# Patient Record
Sex: Male | Born: 1955 | Race: White | Hispanic: No | Marital: Single | Smoking: Current every day smoker
Health system: Southern US, Community
[De-identification: ages and names within clinical notes are randomized; demographics above are authoritative.]

## PROBLEM LIST (undated history)

## (undated) DIAGNOSIS — T148XXA Other injury of unspecified body region, initial encounter: Secondary | ICD-10-CM

## (undated) DIAGNOSIS — A159 Respiratory tuberculosis unspecified: Secondary | ICD-10-CM

## (undated) DIAGNOSIS — B20 Human immunodeficiency virus [HIV] disease: Secondary | ICD-10-CM

## (undated) DIAGNOSIS — K632 Fistula of intestine: Secondary | ICD-10-CM

## (undated) DIAGNOSIS — Z933 Colostomy status: Secondary | ICD-10-CM

## (undated) HISTORY — PX: APPENDECTOMY: SHX54

## (undated) HISTORY — PX: COLON SURGERY: SHX602

## (undated) HISTORY — PX: SMALL INTESTINE SURGERY: SHX150

## (undated) HISTORY — PX: CHOLECYSTECTOMY: SHX55

---

## 2006-04-06 DIAGNOSIS — T148XXA Other injury of unspecified body region, initial encounter: Secondary | ICD-10-CM

## 2006-04-06 HISTORY — PX: OTHER SURGICAL HISTORY: SHX169

## 2006-04-06 HISTORY — DX: Other injury of unspecified body region, initial encounter: T14.8XXA

## 2013-06-26 ENCOUNTER — Emergency Department (HOSPITAL_COMMUNITY)

## 2013-06-26 ENCOUNTER — Inpatient Hospital Stay (HOSPITAL_COMMUNITY)

## 2013-06-26 ENCOUNTER — Encounter (HOSPITAL_COMMUNITY): Payer: Self-pay | Admitting: Adult Health

## 2013-06-26 ENCOUNTER — Inpatient Hospital Stay (HOSPITAL_COMMUNITY)
Admission: EM | Admit: 2013-06-26 | Discharge: 2013-07-04 | DRG: 682 | Disposition: A | Discharge status: Home / Self Care | Attending: Pulmonary Disease | Admitting: Pulmonary Disease

## 2013-06-26 DIAGNOSIS — E861 Hypovolemia: Secondary | ICD-10-CM | POA: Diagnosis present

## 2013-06-26 DIAGNOSIS — D649 Anemia, unspecified: Secondary | ICD-10-CM | POA: Diagnosis not present

## 2013-06-26 DIAGNOSIS — R918 Other nonspecific abnormal finding of lung field: Secondary | ICD-10-CM

## 2013-06-26 DIAGNOSIS — F17200 Nicotine dependence, unspecified, uncomplicated: Secondary | ICD-10-CM | POA: Diagnosis present

## 2013-06-26 DIAGNOSIS — R222 Localized swelling, mass and lump, trunk: Secondary | ICD-10-CM

## 2013-06-26 DIAGNOSIS — N183 Chronic kidney disease, stage 3 unspecified: Secondary | ICD-10-CM | POA: Diagnosis present

## 2013-06-26 DIAGNOSIS — N19 Unspecified kidney failure: Secondary | ICD-10-CM

## 2013-06-26 DIAGNOSIS — B20 Human immunodeficiency virus [HIV] disease: Secondary | ICD-10-CM | POA: Diagnosis present

## 2013-06-26 DIAGNOSIS — J984 Other disorders of lung: Secondary | ICD-10-CM

## 2013-06-26 DIAGNOSIS — I82409 Acute embolism and thrombosis of unspecified deep veins of unspecified lower extremity: Secondary | ICD-10-CM

## 2013-06-26 DIAGNOSIS — L270 Generalized skin eruption due to drugs and medicaments taken internally: Secondary | ICD-10-CM

## 2013-06-26 DIAGNOSIS — E872 Acidosis, unspecified: Secondary | ICD-10-CM | POA: Diagnosis present

## 2013-06-26 DIAGNOSIS — Z418 Encounter for other procedures for purposes other than remedying health state: Secondary | ICD-10-CM

## 2013-06-26 DIAGNOSIS — Z933 Colostomy status: Secondary | ICD-10-CM

## 2013-06-26 DIAGNOSIS — I959 Hypotension, unspecified: Secondary | ICD-10-CM

## 2013-06-26 DIAGNOSIS — N179 Acute kidney failure, unspecified: Principal | ICD-10-CM

## 2013-06-26 DIAGNOSIS — Z21 Asymptomatic human immunodeficiency virus [HIV] infection status: Secondary | ICD-10-CM

## 2013-06-26 DIAGNOSIS — E875 Hyperkalemia: Secondary | ICD-10-CM

## 2013-06-26 DIAGNOSIS — I10 Essential (primary) hypertension: Secondary | ICD-10-CM | POA: Diagnosis present

## 2013-06-26 DIAGNOSIS — A150 Tuberculosis of lung: Secondary | ICD-10-CM

## 2013-06-26 DIAGNOSIS — J189 Pneumonia, unspecified organism: Secondary | ICD-10-CM

## 2013-06-26 DIAGNOSIS — E871 Hypo-osmolality and hyponatremia: Secondary | ICD-10-CM | POA: Diagnosis present

## 2013-06-26 DIAGNOSIS — J438 Other emphysema: Secondary | ICD-10-CM | POA: Diagnosis present

## 2013-06-26 DIAGNOSIS — Z2989 Encounter for other specified prophylactic measures: Secondary | ICD-10-CM

## 2013-06-26 DIAGNOSIS — Z23 Encounter for immunization: Secondary | ICD-10-CM

## 2013-06-26 HISTORY — DX: Colostomy status: Z93.3

## 2013-06-26 HISTORY — DX: Human immunodeficiency virus (HIV) disease: B20

## 2013-06-26 HISTORY — DX: Respiratory tuberculosis unspecified: A15.9

## 2013-06-26 HISTORY — DX: Fistula of intestine: K63.2

## 2013-06-26 HISTORY — DX: Other injury of unspecified body region, initial encounter: T14.8XXA

## 2013-06-26 LAB — BASIC METABOLIC PANEL
BUN: 111 mg/dL — ABNORMAL HIGH (ref 6–23)
BUN: 102 mg/dL — ABNORMAL HIGH (ref 6–23)
CALCIUM: 10.3 mg/dL (ref 8.4–10.5)
CALCIUM: 8.4 mg/dL (ref 8.4–10.5)
CO2: 17 mEq/L — ABNORMAL LOW (ref 19–32)
CO2: 16 mEq/L — ABNORMAL LOW (ref 19–32)
CREATININE: 2.92 mg/dL — AB (ref 0.50–1.35)
Chloride: 86 mEq/L — ABNORMAL LOW (ref 96–112)
Chloride: 96 mEq/L (ref 96–112)
Creatinine, Ser: 3.5 mg/dL — ABNORMAL HIGH (ref 0.50–1.35)
GFR calc Af Amer: 26 mL/min — ABNORMAL LOW (ref >90)
GFR calc non Af Amer: 23 mL/min — ABNORMAL LOW (ref >90)
GFR, EST AFRICAN AMERICAN: 21 mL/min — AB (ref >90)
GFR, EST NON AFRICAN AMERICAN: 18 mL/min — AB (ref >90)
Glucose, Bld: 124 mg/dL — ABNORMAL HIGH (ref 70–99)
Glucose, Bld: 107 mg/dL — ABNORMAL HIGH (ref 70–99)
Potassium: 5.8 mEq/L — ABNORMAL HIGH (ref 3.7–5.3)
SODIUM: 122 meq/L — AB (ref 137–147)
Sodium: 128 mEq/L — ABNORMAL LOW (ref 137–147)

## 2013-06-26 LAB — URINALYSIS, ROUTINE W REFLEX MICROSCOPIC
BILIRUBIN URINE: NEGATIVE
Glucose, UA: NEGATIVE mg/dL
Hgb urine dipstick: NEGATIVE
Ketones, ur: NEGATIVE mg/dL
LEUKOCYTES UA: NEGATIVE
Nitrite: NEGATIVE
PH: 5 (ref 5.0–8.0)
Protein, ur: NEGATIVE mg/dL
SPECIFIC GRAVITY, URINE: 1.022 (ref 1.005–1.030)
Urobilinogen, UA: 0.2 mg/dL (ref 0.0–1.0)

## 2013-06-26 LAB — CBC
HCT: 43.2 % (ref 39.0–52.0)
Hemoglobin: 14.6 g/dL (ref 13.0–17.0)
MCH: 25.4 pg — ABNORMAL LOW (ref 26.0–34.0)
MCHC: 33.8 g/dL (ref 30.0–36.0)
MCV: 75.3 fL — ABNORMAL LOW (ref 78.0–100.0)
Platelets: 265 10*3/uL (ref 150–400)
RBC: 5.74 MIL/uL (ref 4.22–5.81)
RDW: 16.2 % — AB (ref 11.5–15.5)
WBC: 7.5 10*3/uL (ref 4.0–10.5)

## 2013-06-26 LAB — TROPONIN I: Troponin I: <0.3 ng/mL (ref <0.30)

## 2013-06-26 LAB — I-STAT TROPONIN, ED: Troponin i, poc: 0.01 ng/mL (ref 0.00–0.08)

## 2013-06-26 LAB — TSH: TSH: 2.899 u[IU]/mL (ref 0.350–4.500)

## 2013-06-26 LAB — MAGNESIUM: Magnesium: 1.9 mg/dL (ref 1.5–2.5)

## 2013-06-26 LAB — PRO B NATRIURETIC PEPTIDE: Pro B Natriuretic peptide (BNP): 936.1 pg/mL — ABNORMAL HIGH (ref 0–125)

## 2013-06-26 LAB — PHOSPHORUS: Phosphorus: 5.8 mg/dL — ABNORMAL HIGH (ref 2.3–4.6)

## 2013-06-26 LAB — MRSA PCR SCREENING: MRSA by PCR: NEGATIVE

## 2013-06-26 LAB — STREP PNEUMONIAE URINARY ANTIGEN: Strep Pneumo Urinary Antigen: NEGATIVE

## 2013-06-26 LAB — D-DIMER, QUANTITATIVE (NOT AT ARMC): D DIMER QUANT: 1.29 ug{FEU}/mL — AB (ref 0.00–0.48)

## 2013-06-26 LAB — PROCALCITONIN: Procalcitonin: 0.27 ng/mL

## 2013-06-26 MED ORDER — SODIUM CHLORIDE 0.9 % IV BOLUS (SEPSIS)
500.0000 mL | Freq: Once | INTRAVENOUS | Status: AC
  Administered 2013-06-26: 500 mL via INTRAVENOUS

## 2013-06-26 MED ORDER — AZITHROMYCIN 500 MG IV SOLR
500.0000 mg | Freq: Once | INTRAVENOUS | Status: AC
  Administered 2013-06-26: 500 mg via INTRAVENOUS

## 2013-06-26 MED ORDER — SODIUM CHLORIDE 0.9 % IV BOLUS (SEPSIS)
1000.0000 mL | Freq: Once | INTRAVENOUS | Status: AC
Start: 2013-06-26 — End: 2013-06-26
  Administered 2013-06-26: 1000 mL via INTRAVENOUS

## 2013-06-26 MED ORDER — SODIUM BICARBONATE 8.4 % IV SOLN
50.0000 meq | Freq: Once | INTRAVENOUS | Status: AC
  Administered 2013-06-26: 50 meq via INTRAVENOUS
  Filled 2013-06-26: qty 50

## 2013-06-26 MED ORDER — INSULIN ASPART 100 UNIT/ML IV SOLN
10.0000 [IU] | Freq: Once | INTRAVENOUS | Status: AC
  Administered 2013-06-26: 10 [IU] via INTRAVENOUS
  Filled 2013-06-26: qty 0.1

## 2013-06-26 MED ORDER — SODIUM CHLORIDE 0.9 % IV SOLN
1.0000 g | Freq: Once | INTRAVENOUS | Status: AC
  Administered 2013-06-26: 1 g via INTRAVENOUS
  Filled 2013-06-26: qty 10

## 2013-06-26 MED ORDER — TECHNETIUM TC 99M DIETHYLENETRIAME-PENTAACETIC ACID: 40.0000 | Freq: Once | INTRAVENOUS | Status: AC | PRN

## 2013-06-26 MED ORDER — SODIUM CHLORIDE 0.9 % IV BOLUS (SEPSIS)
2000.0000 mL | Freq: Once | INTRAVENOUS | Status: AC
Start: 2013-06-26 — End: 2013-06-26
  Administered 2013-06-26: 2000 mL via INTRAVENOUS

## 2013-06-26 MED ORDER — SODIUM CHLORIDE 0.9 % IV SOLN
INTRAVENOUS | Status: DC
  Administered 2013-06-26: 12:00:00 via INTRAVENOUS
  Administered 2013-06-27: 100 mL/h via INTRAVENOUS

## 2013-06-26 MED ORDER — TUBERCULIN PPD 5 UNIT/0.1ML ID SOLN
5.0000 [IU] | Freq: Once | INTRADERMAL | Status: AC
  Administered 2013-06-26: 5 [IU] via INTRADERMAL
  Filled 2013-06-26: qty 0.1

## 2013-06-26 MED ORDER — TECHNETIUM TO 99M ALBUMIN AGGREGATED
6.0000 | Freq: Once | INTRAVENOUS | Status: AC | PRN
  Administered 2013-06-26: 6 via INTRAVENOUS

## 2013-06-26 MED ORDER — DEXTROSE 50 % IV SOLN
1.0000 | Freq: Once | INTRAVENOUS | Status: AC
  Administered 2013-06-26: 50 mL via INTRAVENOUS
  Filled 2013-06-26: qty 50

## 2013-06-26 MED ORDER — ALBUTEROL SULFATE (2.5 MG/3ML) 0.083% IN NEBU
10.0000 mg | INHALATION_SOLUTION | Freq: Once | RESPIRATORY_TRACT | Status: AC
  Administered 2013-06-26: 10 mg via RESPIRATORY_TRACT
  Filled 2013-06-26: qty 12

## 2013-06-26 MED ORDER — SODIUM CHLORIDE 0.9 % IV SOLN: 250.0000 mL | INTRAVENOUS | Status: DC | PRN

## 2013-06-26 MED ORDER — CALCIUM GLUCONATE 10 % IV SOLN
1.0000 g | Freq: Once | INTRAVENOUS | Status: DC
  Filled 2013-06-26: qty 10

## 2013-06-26 MED ORDER — DEXTROSE 5 % IV SOLN
2.0000 g | INTRAVENOUS | Status: DC
  Administered 2013-06-26 – 2013-06-29 (×4): 2 g via INTRAVENOUS
  Filled 2013-06-26 (×6): qty 2

## 2013-06-26 MED ORDER — HEPARIN SODIUM (PORCINE) 5000 UNIT/ML IJ SOLN
5000.0000 [IU] | Freq: Three times a day (TID) | INTRAMUSCULAR | Status: DC
  Administered 2013-06-26 – 2013-06-27 (×2): 5000 [IU] via SUBCUTANEOUS
  Filled 2013-06-26 (×5): qty 1

## 2013-06-26 NOTE — H&P (Signed)
PULMONARY / CRITICAL CARE MEDICINE   Name: David Fry MRN: [id number] DOB: [birth year]    ADMISSION DATE:  06/26/2013  REFERRING MD :  EDP  PRIMARY SERVICE: PCCM   CHIEF COMPLAINT:  Dyspnea, acute renal failure   BRIEF PATIENT DESCRIPTION: 58 yo active smoker without significant medical history [place], presented 3/23 with 2 weeks of dyspnea, weakness and fatigue.  In ER found to be in acute renal failure with hyperkalemia, hypotensive, with RUL cavitary infiltrate.   SIGNIFICANT EVENTS / STUDIES:  3/23  CT chest >>> 3/23 VQ scan >>> 3/23  BLE dopplers >>>  LINES / TUBES:  CULTURES: 3/23  Sputum 3/23  >>> 3/23  BCx2 3/23  >>> 3/23  Urine Strep Ag >>> 3/23  Urine Legionella Ag  >>>  ANTIBIOTICS: Rocephin 3/23 >>> Azithrimycin 3/23 >>>  HISTORY OF PRESENT ILLNESS:  58 yo male with only PMH crush injury to abdomen 2008 [place], presented 3/23 with 2 week hx dyspnea, weakness and fatigue.  In ER found to have significant acute renal failure with hyperkalemia, hypotension and RUL cavitary infiltrate.  Pt has been feeling weak, DOE x 2 weeks.  Currently works at [employer] desk but previously as a Medical sales representativemechanic in aviation.  Has traveled all over the world  No known TB exposure.  Has had neg PPD in past. Denies leg/calf pain, hemoptysis, chest pain.  No notable weight loss recently but poor appetite.   PAST MEDICAL HISTORY :  Past Medical History  Diagnosis Date  . Crush injury trunk 2008  . Colostomy status    Past Surgical History  Procedure Laterality Date  . Abdominal surgery  2008   Prior to Admission medications   Not on File   No Known Allergies  FAMILY HISTORY:  No family history on file. SOCIAL HISTORY:  reports that he has been smoking Cigarettes.  He has a 40 pack-year smoking history. He does not have any smokeless tobacco history on file. His alcohol and drug histories are not on file.  REVIEW OF SYSTEMS:   As per HPI - All other systems reviewed and were neg.   SUBJECTIVE:   VITAL SIGNS: Temp:  [97.9 F (36.6 C)] 97.9 F (36.6 C) (03/23 0750) Pulse Rate:  [99] 99 (03/23 0723) Resp:  [18] 18 (03/23 0723) BP: (104)/(73) 104/73 mmHg (03/23 0723) SpO2:  [97 %] 97 % (03/23 0857)  HEMODYNAMICS:   VENTILATOR SETTINGS:   INTAKE / OUTPUT: Intake/Output   None     PHYSICAL EXAMINATION: General:  Pleasant male, NAD appears older than stated age Neuro:  Awake, alert, appropriate HEENT:  Mm moist, no JVD  Cardiovascular:  s1s2 rrr, intermittent tachy Lungs:  resps even, non labored on [place], slightly diminished R, otherwise essentially clear  Abdomen:  Soft, +bs, colostomy Musculoskeletal:  Warm and dry, no sig edema   LABS:  CBC  Recent Labs Lab 06/26/13 0741  WBC 7.5  HGB 14.6  HCT 43.2  PLT 265   Coag's No results found for this basename: APTT, INR,  in the last 168 hours BMET  Recent Labs Lab 06/26/13 0741  NA 122*  K >7.7*  CL 86*  CO2 17*  BUN 111*  CREATININE 3.50*  GLUCOSE 124*   Electrolytes  Recent Labs Lab 06/26/13 0741  CALCIUM 10.3   Sepsis Markers No results found for this basename: LATICACIDVEN, PROCALCITON, O2SATVEN,  in the last 168 hours ABG No results found for this  basename: PHART, PCO2ART, PO2ART,  in the last 168 hours Liver Enzymes No results found for this basename: AST, ALT, ALKPHOS, BILITOT, ALBUMIN,  in the last 168 hours Cardiac Enzymes  Recent Labs Lab 06/26/13 0741  PROBNP 936.1*   Glucose No results found for this basename: GLUCAP,  in the last 168 hours  Imaging Dg Chest 2 View  06/26/2013   CLINICAL DATA:  Weakness and dyspnea  EXAM: CHEST  2 VIEW  COMPARISON:  No comparisons  FINDINGS: The lungs are well-expanded. There are abnormal parenchymal densities in the right upper lobe. There is an air-fluid level within 1 of these densities. There is subtle density that projects in the region of the left first costosternal  junction but this may be due to costochondral junction prominence. The cardiac silhouette is normal in size. The pulmonary vascularity is not engorged. The mediastinum is normal in width. There is no pleural effusion though the tips of the costophrenic angles are not completely included in the field of view.  IMPRESSION: 1. Patchy cavitary infiltrates in the right upper lobe are demonstrated. These findings could be infectious or neoplastic. Tuberculosis is in the differential. There may be minimal density in the left apex though this is not a definite finding. Further evaluation with chest CT scanning now is recommended. 2. There is borderline hyperinflation which may reflect an element of COPD. There is no evidence of CHF. No definite pleural effusion is demonstrated. 3. These results were called by telephone at the time of interpretation on 06/26/2013 at 8:56 AM to [facility], [facility] , who verbally acknowledged these results.   Electronically Signed   By: David  [place]   On: 06/26/2013 08:57   ASSESSMENT / PLAN:  PULMONARY A: Suspected COPD / emphysema Possible cavitary pneumonia Possible CAP overimposed on bullous apical changes Less likely cavitary lung mass Doubt pulmonary TB Fungal infection? PE unlikely P:   CT chest without contrast  VQ scan r/o PE - drove country immediately prior to onset dyspnea  BL LE Venous Dopplers  May need FOB  CARDIOVASCULAR A: No active issue  P:  No intervention required  RENAL Dehydration / hypovolemia Acute renal failure  Hyperkalemia Hyponatremia  P:   Ca / bicarbonate / D50 / insulin Trend BMP NS x 3L NS@100  Renal ultrasound   GASTROINTESTINAL A: Colostomy, previous crush injury, developed small bowel fistula Nutrition GI Px is not indicate P:   Regular diet  HEMATOLOGIC A: No active issue  P:  Trend CBC   INFECTIOUS A: Possible CAP Possible fungal pulmonary infection Possible TB  P:   Cx / abx as above Urine Strep /  Legionella Ag PCT HIV serology PPD  ENDOCRINE A: No active issues   P:   No interventions required  NEUROLOGIC A: No active issue  P:   No intervention required  [facility], NP 06/26/2013  11:33 AM Pager: [phone] or [phone]  I have personally obtained history, examined patient, evaluated and interpreted laboratory and imaging results, reviewed medical records, formulated assessment / plan and placed orders.  David Farber, MD Pulmonary and Critical Care Medicine [facility] Pager: [phone]  06/26/2013, 8:51 PM

## 2013-06-26 NOTE — ED Provider Notes (Signed)
CSN: [id number]     Arrival date & time 06/26/13  0715 History   First MD Initiated Contact with Patient 06/26/13 0720     Chief Complaint  Patient presents with  . Shortness of Breath  . Weakness     (Consider location/radiation/quality/duration/timing/severity/associated sxs/prior Treatment) HPI Comments: Patient is a 58 year old male with no significant past medical history who presents to the emergency department complaining of 2 weeks of shortness of breath, worsening over the past 2 days. Shortness of breath worse on exertion, states he cannot walk more than 20 feet without feeling short of breath and dizzy. He has been feeling weak and fatigued. Denies associated chest pain. [place], he drove cross-country at that time. Denies calf pain or swelling. Denies ever having symptoms like this in the past. No history of blood clots. Denies family history of early heart disease. He is a daily smoker. Denies cough or wheezing, fever or chills.  The history is provided by the patient.    No past medical history on file. No past surgical history on file. No family history on file. History  Substance Use Topics  . Smoking status: Not on file  . Smokeless tobacco: Not on file  . Alcohol Use: Not on file    Review of Systems  Constitutional: Positive for fatigue.  Respiratory: Positive for shortness of breath.   Neurological: Positive for weakness and light-headedness.  All other systems reviewed and are negative.      Allergies  Review of patient's allergies indicates not on file.  Home Medications  No current outpatient prescriptions on file. BP 104/73  Pulse 99  Resp 18  SpO2 97% Physical Exam  Nursing note and vitals reviewed. Constitutional: He is oriented to person, place, and time. He appears well-developed and well-nourished. No distress.  HENT:  Head: Normocephalic and atraumatic.  Mouth/Throat: Oropharynx is clear and moist.   Eyes: Conjunctivae and EOM are normal. Pupils are equal, round, and reactive to light.  Neck: Normal range of motion. Neck supple. No JVD present.  Cardiovascular: Normal rate, regular rhythm, normal heart sounds and intact distal pulses.   No extremity edema.  Pulmonary/Chest: Effort normal and breath sounds normal. No respiratory distress.  Abdominal: Soft. Bowel sounds are normal. There is no tenderness.  Musculoskeletal: Normal range of motion. He exhibits no edema.  Neurological: He is alert and oriented to person, place, and time. He has normal strength. No sensory deficit.  Speech fluent, goal oriented. Moves limbs without ataxia. Equal grip strength bilateral.  Skin: Skin is warm and dry. He is not diaphoretic.  Psychiatric: He has a normal mood and affect. His behavior is normal.    ED Course  Procedures (including critical care time)  CRITICAL CARE Performed by: Johnnette GourdAlbert, Allysson Rinehimer   Total critical care time: 30 minutes  Critical care time was exclusive of separately billable procedures and treating other patients.  Critical care was necessary to treat or prevent imminent or life-threatening deterioration.  Critical care was time spent personally by me on the following activities: development of treatment plan with patient and/or surrogate as well as nursing, discussions with consultants, evaluation of patient's response to treatment, examination of patient, obtaining history from patient or surrogate, ordering and performing treatments and interventions, ordering and review of laboratory studies, ordering and review of radiographic studies, pulse oximetry and re-evaluation of patient's condition.  Labs Review Labs Reviewed  CBC - Abnormal; Notable for the following:  MCV 75.3 (*)    MCH 25.4 (*)    RDW 16.2 (*)    All other components within normal limits  BASIC METABOLIC PANEL - Abnormal; Notable for the following:    Sodium 122 (*)    Potassium >7.7 (*)    Chloride  86 (*)    CO2 17 (*)    Glucose, Bld 124 (*)    BUN 111 (*)    Creatinine, Ser 3.50 (*)    GFR calc non Af Amer 18 (*)    GFR calc Af Amer 21 (*)    All other components within normal limits  D-DIMER, QUANTITATIVE - Abnormal; Notable for the following:    D-Dimer, Quant 1.29 (*)    All other components within normal limits  PRO B NATRIURETIC PEPTIDE - Abnormal; Notable for the following:    Pro B Natriuretic peptide (BNP) 936.1 (*)    All other components within normal limits  BASIC METABOLIC PANEL - Abnormal; Notable for the following:    Sodium 128 (*)    Potassium 5.8 (*)    CO2 16 (*)    Glucose, Bld 107 (*)    BUN 102 (*)    Creatinine, Ser 2.92 (*)    GFR calc non Af Amer 23 (*)    GFR calc Af Amer 26 (*)    All other components within normal limits  URINE CULTURE  TROPONIN I  MAGNESIUM  PHOSPHORUS  PROCALCITONIN  TROPONIN I  TROPONIN I  URINALYSIS, ROUTINE W REFLEX MICROSCOPIC  STREP PNEUMONIAE URINARY ANTIGEN  LEGIONELLA ANTIGEN, URINE  TSH  I-STAT TROPOININ, ED   Imaging Review Dg Chest 2 View  06/26/2013   CLINICAL DATA:  Weakness and dyspnea  EXAM: CHEST  2 VIEW  COMPARISON:  No comparisons  FINDINGS: The lungs are well-expanded. There are abnormal parenchymal densities in the right upper lobe. There is an air-fluid level within 1 of these densities. There is subtle density that projects in the region of the left first costosternal junction but this may be due to costochondral junction prominence. The cardiac silhouette is normal in size. The pulmonary vascularity is not engorged. The mediastinum is normal in width. There is no pleural effusion though the tips of the costophrenic angles are not completely included in the field of view.  IMPRESSION: 1. Patchy cavitary infiltrates in the right upper lobe are demonstrated. These findings could be infectious or neoplastic. Tuberculosis is in the differential. There may be minimal density in the left apex though this is  not a definite finding. Further evaluation with chest CT scanning now is recommended. 2. There is borderline hyperinflation which may reflect an element of COPD. There is no evidence of CHF. No definite pleural effusion is demonstrated. 3. These results were called by telephone at the time of interpretation on 06/26/2013 at 8:56 AM to [facility], [facility] , who verbally acknowledged these results.   Electronically Signed   By: David  [place]   On: 06/26/2013 08:57   Ct Chest Wo Contrast  06/26/2013   CLINICAL DATA:  shortness of breath.  Abnormal chest x-ray.  EXAM: CT CHEST WITHOUT CONTRAST  TECHNIQUE: Multidetector CT imaging of the chest was performed following the standard protocol without IV contrast.  COMPARISON:  06/26/2013  FINDINGS: Abnormal area of airspace consolidation with associated large area of cavitation noted in the right upper lobe. The cavitary area measures approximately 9.5 x 4.9 cm on image 16. Extensive surrounding consolidation/airspace disease. Differential considerations include cavitary neoplasm  or area of a infection/pneumonia with abscess formation. Tuberculosis is a possibility.  No other areas of consolidation. No pleural effusions. No mediastinal visible hilar or axillary adenopathy. No supraclavicular adenopathy.  Chest wall soft tissues are unremarkable. Imaging into the upper abdomen shows no acute findings.  IMPRESSION: Large cavitary masslike area in the right upper lobe measuring up to 9.5 cm with surrounding extensive airspace consolidation. Differential considerations would include cavitary neoplasm or pneumonia. Tuberculosis is not excluded. Recommend clinical correlation. No associated effusion or adenopathy.   Electronically Signed   By: Charlett Nose M.D.   On: 06/26/2013 11:45     EKG Interpretation   Date/Time:  Monday June 26 2013 07:20:31 EDT Ventricular Rate:  99 PR Interval:  182 QRS Duration: 120 QT Interval:  366 QTC Calculation: 470 R Axis:   -86 Text  Interpretation:  Sinus rhythm Right atrial enlargement Incomplete  RBBB and LAFB ST elevation, consider inferior injury No previous ECGs  available Confirmed by [facility]  MD, Jonny Ruiz ([id number]) on 06/26/2013 7:29:39 AM      MDM   Final diagnoses:  Renal failure  Hypotension   Patient presenting with shortness of breath on exertion, weakness and fatigue. He appears in no apparent distress. No associated chest pain. No calf pain or swelling. Recent drive cross country 2 weeks ago. Concern for possible PE. Denies any significant medical history. Initial EKG with slight slurring of the j-wave, peaked t-waves, plan to repeat twice. Labs pending. Case discussed with attending Dr. Fonnie Jarvis who also evaluated patient and agrees with plan of care. 8:08 AM Repeat EKG improved from initial. 8:42 AM D-dimer 1.29. Will obtain VQ scan given patient's creatinine 3.5. Potassium 7.7, fluid EKG changes of peaked T waves and widened QRS, we'll give calcium gluconate, albuterol, insulin, D50 and bicarbonate. 9:12 AM Chest x-ray showing patchy cavitary infiltrates in the right upper lobe, either infectious or neoplastic. He is afebrile, nuclear psychosis. Will obtain chest CT without contrast prior to antibiotic administration. 9:55 AM Systolic BP in the 70s. Plan to give additional 2L fluid bolus. Will consult critical care. 10:18 AM Systolic BP 88. Critical care will evaluate patient.  Pt admitted by critical care.  Trevor Mace, PA-C 06/26/13 1227

## 2013-06-26 NOTE — Progress Notes (Signed)
Bilateral lower extremity venous duplex completed.  Right:  No evidence of DVT, superficial thrombosis, or Baker's cyst.  Left: DVT noted peroneal vein.  No evidence of superficial thrombosis.  No Baker's cyst.   

## 2013-06-26 NOTE — ED Notes (Signed)
Pt here for sob and weakness, sts he gets very sob with any exertion, [place] and sts he got here on march 3rd, sts weakness has been ongoing for several weeks.

## 2013-06-26 NOTE — ED Provider Notes (Signed)
Medical screening examination/treatment/procedure(s) were conducted as a shared visit with non-physician practitioner(s) and myself.  I personally evaluated the patient during the encounter.   EKG Interpretation   Date/Time:  Monday June 26 2013 08:02:07 EDT Ventricular Rate:  93 PR Interval:  180 QRS Duration: 112 QT Interval:  328 QTC Calculation: 407 R Axis:   -82 Text Interpretation:  Normal sinus rhythm Left axis deviation Pulmonary  disease pattern Right bundle branch block No significant change since last  tracing Confirmed by [facility]  David Fry, Jonny RuizJOHN ([id number]) on 06/26/2013 8:21:29 AM     Gradual onset several weeks of generalized weakness, fatigue, exertional dyspnea, no CP, no SOB at rest, no edema, no weight gain, L:CTA, initial ECG subtle nondiagnositic ST elevation inferior leads and clinically doubt STEMI based on initial evaluation. 0725  Although Pt presented initially nontoxic appearing, he is critically ill with renal failure, dehydration, hyperkalemia, developed hypotension in ED initially responsive to IVF bolus, and has possible lung mass without clinical symptoms to suggest PNA such as fever, cough, weight loss, or sweats.  David HornJohn M Terel Bann, David Fry 06/26/13 2110

## 2013-06-26 NOTE — Progress Notes (Signed)
eLink Physician-Brief Progress Note Patient Name: David Fry DOB: 05/14/1955 MRN: [id number]  Date of Service  06/26/2013   HPI/Events of Note   Pt hypotensive  eICU Interventions  500cc bolus NS given   Intervention Category Major Interventions: Hypotension - evaluation and management  David Fry 06/26/2013, 10:41 PM

## 2013-06-26 NOTE — ED Notes (Signed)
Patient transported to X-ray 

## 2013-06-26 NOTE — ED Notes (Signed)
Placed pt on airborne precautions after receiving report that CT cannot rule out TB. Notified critical care and flow manager to place pt in room with negative pressure.

## 2013-06-26 NOTE — ED Notes (Signed)
David Fry with nuclear medicine confirm to get pt for NM Pulmonary Perf and Vent study.

## 2013-06-26 NOTE — ED Notes (Signed)
Patient transported to CT 

## 2013-06-26 NOTE — ED Notes (Signed)
Pt still in VQ scan  

## 2013-06-27 ENCOUNTER — Inpatient Hospital Stay (HOSPITAL_COMMUNITY)

## 2013-06-27 DIAGNOSIS — I959 Hypotension, unspecified: Secondary | ICD-10-CM

## 2013-06-27 LAB — LEGIONELLA ANTIGEN, URINE: LEGIONELLA ANTIGEN, URINE: NEGATIVE

## 2013-06-27 LAB — URINE CULTURE: Colony Count: 1000

## 2013-06-27 LAB — CBC
HCT: 32.1 % — ABNORMAL LOW (ref 39.0–52.0)
Hemoglobin: 10.6 g/dL — ABNORMAL LOW (ref 13.0–17.0)
MCH: 25.2 pg — AB (ref 26.0–34.0)
MCHC: 33 g/dL (ref 30.0–36.0)
MCV: 76.4 fL — AB (ref 78.0–100.0)
Platelets: 166 10*3/uL (ref 150–400)
RBC: 4.2 MIL/uL — ABNORMAL LOW (ref 4.22–5.81)
RDW: 16.6 % — AB (ref 11.5–15.5)
WBC: 4 10*3/uL (ref 4.0–10.5)

## 2013-06-27 LAB — BASIC METABOLIC PANEL
BUN: 67 mg/dL — AB (ref 6–23)
CALCIUM: 8 mg/dL — AB (ref 8.4–10.5)
CO2: 17 mEq/L — ABNORMAL LOW (ref 19–32)
CREATININE: 1.84 mg/dL — AB (ref 0.50–1.35)
Chloride: 102 mEq/L (ref 96–112)
GFR, EST AFRICAN AMERICAN: 46 mL/min — AB (ref >90)
GFR, EST NON AFRICAN AMERICAN: 40 mL/min — AB (ref >90)
Glucose, Bld: 74 mg/dL (ref 70–99)
Potassium: 5.5 mEq/L — ABNORMAL HIGH (ref 3.7–5.3)
Sodium: 132 mEq/L — ABNORMAL LOW (ref 137–147)

## 2013-06-27 LAB — HEPARIN LEVEL (UNFRACTIONATED): Heparin Unfractionated: <0.1 IU/mL — ABNORMAL LOW (ref 0.30–0.70)

## 2013-06-27 LAB — HIV ANTIBODY (ROUTINE TESTING W REFLEX): HIV: REACTIVE — AB

## 2013-06-27 LAB — TROPONIN I: Troponin I: <0.3 ng/mL (ref <0.30)

## 2013-06-27 LAB — LACTIC ACID, PLASMA: Lactic Acid, Venous: 1.3 mmol/L (ref 0.5–2.2)

## 2013-06-27 LAB — CORTISOL: Cortisol, Plasma: 25.6 ug/dL

## 2013-06-27 MED ORDER — ACETAMINOPHEN 325 MG PO TABS
650.0000 mg | ORAL_TABLET | Freq: Four times a day (QID) | ORAL | Status: DC | PRN
  Administered 2013-06-27: 650 mg via ORAL
  Filled 2013-06-27: qty 2

## 2013-06-27 MED ORDER — SODIUM CHLORIDE 0.9 % IV BOLUS (SEPSIS)
500.0000 mL | Freq: Once | INTRAVENOUS | Status: AC
  Administered 2013-06-27: 500 mL via INTRAVENOUS

## 2013-06-27 MED ORDER — HEPARIN BOLUS VIA INFUSION
3000.0000 [IU] | Freq: Once | INTRAVENOUS | Status: AC
  Administered 2013-06-27: 3000 [IU] via INTRAVENOUS
  Filled 2013-06-27: qty 3000

## 2013-06-27 MED ORDER — SODIUM POLYSTYRENE SULFONATE 15 GM/60ML PO SUSP
30.0000 g | Freq: Once | ORAL | Status: AC
  Administered 2013-06-27: 30 g via ORAL
  Filled 2013-06-27: qty 120

## 2013-06-27 MED ORDER — HEPARIN BOLUS VIA INFUSION
3000.0000 [IU] | Freq: Once | INTRAVENOUS | Status: AC
Start: 2013-06-27 — End: 2013-06-27
  Administered 2013-06-27: 3000 [IU] via INTRAVENOUS
  Filled 2013-06-27: qty 3000

## 2013-06-27 MED ORDER — HEPARIN (PORCINE) IN NACL 100-0.45 UNIT/ML-% IJ SOLN
1700.0000 [IU]/h | INTRAMUSCULAR | Status: DC
Start: 2013-06-27 — End: 2013-06-28
  Administered 2013-06-27: 1500 [IU]/h via INTRAVENOUS
  Administered 2013-06-28: 1700 [IU]/h via INTRAVENOUS
  Filled 2013-06-27 (×4): qty 250

## 2013-06-27 NOTE — Progress Notes (Signed)
PULMONARY / CRITICAL CARE MEDICINE   Name: David Fry MRN: [id number] DOB: 09-04-1955    ADMISSION DATE:  06/26/2013  REFERRING MD :  EDP  PRIMARY SERVICE: PCCM   CHIEF COMPLAINT:  Dyspnea, acute renal failure   BRIEF PATIENT DESCRIPTION: 58 yo active smoker without significant medical history [place] [facility], presented 3/23 with 2 weeks of dyspnea, weakness and fatigue.  In ER found to be in acute renal failure with hyperkalemia, hypotensive, with RUL cavitary infiltrate.   SIGNIFICANT EVENTS / STUDIES:  3/23  CT chest >>> 3/23 VQ scan >>> 3/23  BLE dopplers >>>  LINES / TUBES:  CULTURES: 3/23  Sputum 3/23  >>> 3/23  BCx2 3/23  >>> 3/23  Urine Strep Ag >>> 3/23  Urine Legionella Ag  >>>  ANTIBIOTICS: Rocephin 3/23 >>> Azithrimycin 3/23 >>>  SUBJECTIVE: Hypotensive overnight but completely asymptomatic.  VITAL SIGNS: Temp:  [97.6 F (36.4 C)-98.8 F (37.1 C)] 97.6 F (36.4 C) (03/24 0324) Pulse Rate:  [60-76] 61 (03/24 0324) Resp:  [14-22] 21 (03/24 0324) BP: (78-104)/(43-58) 100/43 mmHg (03/24 0324) SpO2:  [99 %-100 %] 100 % (03/24 0324) Weight:  [216 lb 0.8 oz (98 kg)-216 lb 4.3 oz (98.1 kg)] 216 lb 0.8 oz (98 kg) (03/24 0324)  HEMODYNAMICS:   VENTILATOR SETTINGS:   INTAKE / OUTPUT: Intake/Output     03/23 0701 - 03/24 0700 03/24 0701 - 03/25 0700   P.O. 240    I.V. (mL/kg) 1771.7 (18.1)    IV Piggyback 50    Total Intake(mL/kg) 2061.7 (21)    Urine (mL/kg/hr) 1550 500 (0.7)   Total Output 1550 500   Net +511.7 -500         PHYSICAL EXAMINATION: General:  Pleasant male, NAD appears older than stated age Neuro:  Awake, alert, appropriate HEENT:  Mm moist, no JVD  Cardiovascular:  s1s2 rrr, intermittent tachy Lungs:  resps even, non labored on [place], slightly diminished R, otherwise essentially clear  Abdomen:  Soft, +bs, colostomy Musculoskeletal:  Warm and dry, no sig edema   LABS:  CBC  Recent Labs Lab 06/26/13 0741  06/27/13 0410  WBC 7.5 4.0  HGB 14.6 10.6*  HCT 43.2 32.1*  PLT 265 166   Coag's No results found for this basename: APTT, INR,  in the last 168 hours BMET  Recent Labs Lab 06/26/13 0741 06/26/13 1135 06/27/13 0410  NA 122* 128* 132*  K >7.7* 5.8* 5.5*  CL 86* 96 102  CO2 17* 16* 17*  BUN 111* 102* 67*  CREATININE 3.50* 2.92* 1.84*  GLUCOSE 124* 107* 74   Electrolytes  Recent Labs Lab 06/26/13 0741 06/26/13 1135 06/27/13 0410  CALCIUM 10.3 8.4 8.0*  MG  --  1.9  --   PHOS  --  5.8*  --    Sepsis Markers  Recent Labs Lab 06/26/13 1133 06/27/13 0400  LATICACIDVEN  --  1.3  PROCALCITON 0.27  --    ABG No results found for this basename: PHART, PCO2ART, PO2ART,  in the last 168 hours Liver Enzymes No results found for this basename: AST, ALT, ALKPHOS, BILITOT, ALBUMIN,  in the last 168 hours Cardiac Enzymes  Recent Labs Lab 06/26/13 0741 06/26/13 1133 06/26/13 1825 06/27/13 0006  TROPONINI  --  <0.30 <0.30 <0.30  PROBNP 936.1*  --   --   --    Glucose No results found for this basename: GLUCAP,  in the last 168 hours  Imaging Dg Chest 2 View  06/26/2013   CLINICAL DATA:  Weakness and dyspnea  EXAM: CHEST  2 VIEW  COMPARISON:  No comparisons  FINDINGS: The lungs are well-expanded. There are abnormal parenchymal densities in the right upper lobe. There is an air-fluid level within 1 of these densities. There is subtle density that projects in the region of the left first costosternal junction but this may be due to costochondral junction prominence. The cardiac silhouette is normal in size. The pulmonary vascularity is not engorged. The mediastinum is normal in width. There is no pleural effusion though the tips of the costophrenic angles are not completely included in the field of view.  IMPRESSION: 1. Patchy cavitary infiltrates in the right upper lobe are demonstrated. These findings could be infectious or neoplastic. Tuberculosis is in the differential.  There may be minimal density in the left apex though this is not a definite finding. Further evaluation with chest CT scanning now is recommended. 2. There is borderline hyperinflation which may reflect an element of COPD. There is no evidence of CHF. No definite pleural effusion is demonstrated. 3. These results were called by telephone at the time of interpretation on 06/26/2013 at 8:56 AM to [facility] , who verbally acknowledged these results.   Electronically Signed   By: David  [place]   On: 06/26/2013 08:57   Ct Chest Wo Contrast  06/26/2013   CLINICAL DATA:  shortness of breath.  Abnormal chest x-ray.  EXAM: CT CHEST WITHOUT CONTRAST  TECHNIQUE: Multidetector CT imaging of the chest was performed following the standard protocol without IV contrast.  COMPARISON:  06/26/2013  FINDINGS: Abnormal area of airspace consolidation with associated large area of cavitation noted in the right upper lobe. The cavitary area measures approximately 9.5 x 4.9 cm on image 16. Extensive surrounding consolidation/airspace disease. Differential considerations include cavitary neoplasm or area of a infection/pneumonia with abscess formation. Tuberculosis is a possibility.  No other areas of consolidation. No pleural effusions. No mediastinal visible hilar or axillary adenopathy. No supraclavicular adenopathy.  Chest wall soft tissues are unremarkable. Imaging into the upper abdomen shows no acute findings.  IMPRESSION: Large cavitary masslike area in the right upper lobe measuring up to 9.5 cm with surrounding extensive airspace consolidation. Differential considerations would include cavitary neoplasm or pneumonia. Tuberculosis is not excluded. Recommend clinical correlation. No associated effusion or adenopathy.   Electronically Signed   By: Charlett Nose M.D.   On: 06/26/2013 11:45   US Renal  06/26/2013   CLINICAL DATA:  Renal failure.  EXAM: RENAL/URINARY TRACT ULTRASOUND COMPLETE  COMPARISON:  None.  FINDINGS:  Right Kidney:  Length: 12.8 cm. Echogenicity within normal limits. No mass or hydronephrosis visualized.  Left Kidney:  Length: 11.5 cm. Echogenicity within normal limits. No mass or hydronephrosis visualized.  Bladder:  Appears normal for degree of bladder distention.  IMPRESSION: Unremarkable renal ultrasound.   Electronically Signed   By: Charlett Nose M.D.   On: 06/26/2013 12:32   Nm Pulmonary Perf And Vent  06/26/2013   CLINICAL DATA:  58 year old male with shortness of breath.  EXAM: NUCLEAR MEDICINE VENTILATION - PERFUSION LUNG SCAN  TECHNIQUE: Ventilation images were obtained in multiple projections using inhaled aerosol technetium 99 M DTPA. Perfusion images were obtained in multiple projections after intravenous injection of Tc-26m MAA.  RADIOPHARMACEUTICALS:  40 mCi Tc-1m DTPA aerosol and 6 mCi Tc-80m MAA  COMPARISON:  06/26/2013 chest radiograph and CT  FINDINGS: Ventilation: Decreased ventilation within the majority of the right upper lobe noted. No  other ventilation abnormalities are noted.  Perfusion: Decreased perfusion within the majority of right upper lobe identified which matches the ventilation defect. No perfusion-ventilation mismatches are present.  IMPRESSION: Low probability for pulmonary embolus (less than 20%).   Electronically Signed   By: Laveda Abbe M.D.   On: 06/26/2013 17:14   Dg Chest Port 1 View  06/27/2013   CLINICAL DATA:  Shortness of breath and cough.  EXAM: PORTABLE CHEST - 1 VIEW  COMPARISON:  Chest x-ray 06/26/2013.  FINDINGS: There continues to be an area of airspace consolidation and cavitation in the right upper lobe, similar to prior chest radiograph 06/26/2013, although the extent of airspace consolidation appears slightly increased. Lungs are otherwise clear. No pleural effusions. No evidence of pulmonary edema. Heart size and mediastinal contours are unremarkable.  IMPRESSION: 1. Large area of airspace consolidation and cavitation in the right upper lobe  redemonstrated. Findings remain highly concerning for atypical infection, including mycobacterium tuberculosis infection, as previously described. Clinical correlation and consideration for respiratory isolation and sputum sampling is recommended.   Electronically Signed   By: Trudie Reed M.D.   On: 06/27/2013 06:26   ASSESSMENT / PLAN:  PULMONARY A: Suspected COPD / emphysema Possible cavitary pneumonia Possible CAP overimposed on bullous apical changes Less likely cavitary lung mass Doubt pulmonary TB Fungal infection? PE unlikely P:   - CT chest without contrast confirms cavitary lesion - VQ scan r/o PE - negative. - BL LE Venous Dopplers - positive - start heparin drip. - May need FOB - if ID needs then will do.  CARDIOVASCULAR A: No active issue, hypotension is chronic for his patient, lactic acid and UOP are WNL. P:  - No intervention required - KVO IVF.  RENAL Dehydration / hypovolemia Acute renal failure  Hyperkalemia - decreased.  No EKG changes. Hyponatremia  P:   - Trend BMP. - KVO IVF. - Kayexalate x1. - Renal ultrasound noted.  GASTROINTESTINAL A: Colostomy, previous crush injury, developed small bowel fistula Nutrition GI Px is not indicate P:   - Regular diet. - If to bronch then will make NPO.  HEMATOLOGIC A: No active issue  P:  - Trend CBC.  INFECTIOUS A: Possible CAP Possible fungal pulmonary infection Possible TB  P:   - Cx / abx as above. 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 - PPD - ID consult called, if bronch is needed then will do.  ENDOCRINE A: No active issues   P:   - No interventions required.  NEUROLOGIC A: No active issue  P:   - No intervention required.  Will transfer to floor and will call ID consult.  Alyson Reedy, M.D. [facility] Pulmonary/Critical Care Medicine. Pager: [phone]. After hours pager: [phone].

## 2013-06-27 NOTE — Progress Notes (Signed)
Tried calling 6E to verify the oncoming nurse has received the handed off report on this patient. Awaiting nurse to call back.

## 2013-06-27 NOTE — Progress Notes (Signed)
Report called to Tina RN and [facility] transferred to [phone]

## 2013-06-27 NOTE — Progress Notes (Signed)
Utilization Review Completed.  

## 2013-06-27 NOTE — Progress Notes (Signed)
ANTICOAGULATION CONSULT NOTE  Pharmacy Consult for heparin Indication: DVT  No Known Allergies  Patient Measurements: Height: 6\' 5"  (195.6 cm) Weight: 216 lb 0.8 oz (98 kg) IBW/kg (Calculated) : 89.1 Heparin Dosing Weight: 98kg  Vital Signs: Temp: 97.7 F (36.5 C) (03/24 1649) Temp src: Oral (03/24 1649) BP: 104/54 mmHg (03/24 1900) Pulse Rate: 58 (03/24 1900)  Labs:  Recent Labs  06/26/13 0741 06/26/13 1133 06/26/13 1135 06/26/13 1825 06/27/13 0006 06/27/13 0410 06/27/13 1830  HGB 14.6  --   --   --   --  10.6*  --   HCT 43.2  --   --   --   --  32.1*  --   PLT 265  --   --   --   --  166  --   HEPARINUNFRC  --   --   --   --   --   --  <0.10*  CREATININE 3.50*  --  2.92*  --   --  1.84*  --   TROPONINI  --  <0.30  --  <0.30 <0.30  --   --     Estimated Creatinine Clearance: 57.2 ml/min (by C-G formula based on Cr of 1.84).   Medical History: Past Medical History  Diagnosis Date  . Crush injury trunk 2008  . Colostomy status     Medications:  Infusions:  . heparin 1,500 Units/hr (06/27/13 1316)    Assessment: 57 yom presented to the hospital with SOB and ARF. Now to start IV heparin for DVT. VQ scan was reported with low probability of PE. H/H and plts have already dropped from admission so will need to watch closely with full anticoagulation. He was not on anticoagulation PTA. Pt did already receive a dose of SQ heparin this AM.   Initial heparin level < 0.10   Goal of Therapy:  Heparin level 0.3-0.7 units/ml Monitor platelets by anticoagulation protocol: Yes   Plan:  1. Heparin bolus 3000 units IV x 1 2. Heparin gtt to 1700 units/hr 3. Check a 6 hour heparin level 4. Daily heparin level and CBC 5. F/u plans for oral anticoagulation  Elwin Sleightowell, Sebrina Kessner Kay 06/27/2013,7:57 PM

## 2013-06-27 NOTE — Consult Note (Signed)
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 He has an extensive surgical history for abd wound with mesh and skin grafts in the past. He has managed independently at home for 6 years and is well informed regarding pouch application, emptying, and obtaining supplies.  We do not carry the exact type and brand of pouches within [facility] which have been used successfully at home.  Free samples of the products which are most similar were left at the bedside for pt use. Stomal assessment/size: Stoma is 11/2 inches, red and viable, above skin level. Peristomal assessment: Peristomal area with 2 full thickness wounds. 7X4X.2cm and 2X2X.2cm.  Pt states one of these sites is a fistula location. Right outer abd with chronic full thickness wound .2X.2X.1cm, 100% red and moist. Foam dressing to protect and promote healing. Treatment options for stomal/peristomal skin: Pt states he uses ostomy paste to protect site and 2 piece convex convatec pouches.  Current pouch has been leaking "for awhile' according to pt and skin is high risk for further breakdown if this is not contained. Output 600cc liquid brown stool in pouch Ostomy pouching: 2pc. Convex Convatec pouch and paste applied by patient and 3 extra pouching systems left at bedside for his use. Education provided: Staff will need to empty Q HOUR to avoid overfilling and subsequent leakage.   Please re-consult if further assistance is needed.  Thank-you,  Cammie Mcgeeawn Zamoria Boss MSN, RN, CWOCN, [place], CNS [phone]

## 2013-06-27 NOTE — Progress Notes (Signed)
Called to bedside to assess for hypotension.  Patient AAOx4, MAE, speech clear, strong radial pulses and good UOP.  Upper extremities reading with pressures in mid 80's to low 90's.  Thigh readings in low 100's systolic.  Pt reports his blood pressure is normally low 100's to 1-teens systolic at home and he is not on anti-hypertensives at baseline.    Plan: Discussed BP readings with RN.  Given assessment as above, will accept BP reading of 80 or greater with current mental status & UOP.  If clinical change or further concerns, RN to call for assessement.    Canary BrimBrandi Ollis, NP-C [place] [facility] Pgr: [phone] or [phone]

## 2013-06-27 NOTE — Progress Notes (Signed)
ANTICOAGULATION CONSULT NOTE - Initial Consult  Pharmacy Consult for heparin Indication: DVT  No Known Allergies  Patient Measurements: Height: 6\' 5"  (195.6 cm) Weight: 216 lb 0.8 oz (98 kg) IBW/kg (Calculated) : 89.1 Heparin Dosing Weight: 98kg  Vital Signs: Temp: 97.6 F (36.4 C) (03/24 0324) Temp src: Oral (03/24 0324) BP: 100/43 mmHg (03/24 0324) Pulse Rate: 61 (03/24 0324)  Labs:  Recent Labs  06/26/13 0741 06/26/13 1133 06/26/13 1135 06/26/13 1825 06/27/13 0006 06/27/13 0410  HGB 14.6  --   --   --   --  10.6*  HCT 43.2  --   --   --   --  32.1*  PLT 265  --   --   --   --  166  CREATININE 3.50*  --  2.92*  --   --  1.84*  TROPONINI  --  <0.30  --  <0.30 <0.30  --     Estimated Creatinine Clearance: 57.2 ml/min (by C-G formula based on Cr of 1.84).   Medical History: Past Medical History  Diagnosis Date  . Crush injury trunk 2008  . Colostomy status     Medications:  Infusions:  . sodium chloride 100 mL/hr (06/27/13 0839)  . heparin      Assessment: David Fry presented to the hospital with SOB and ARF. Now to start IV heparin for DVT. VQ scan was reported with low probability of PE. H/H and plts have already dropped from admission so will need to watch closely with full anticoagulation. He was not on anticoagulation PTA. Pt did already receive a dose of SQ heparin this AM.    Goal of Therapy:  Heparin level 0.3-0.7 units/ml Monitor platelets by anticoagulation protocol: Yes   Plan:  1. Heparin bolus 3000 units IV x 1 (low bolus since pt received SQ heparin this AM) 2. Heparin gtt 1500 units/hr 3. Check a 6 hour heparin level 4. Daily heparin level and CBC 5. F/u plans for oral anticoagulation  Jael Waldorf, Drake LeachRachel Lynn 06/27/2013,11:06 AM

## 2013-06-27 NOTE — Progress Notes (Signed)
Pt is transferring to 6East rm 13. [facility] [place]. Gave total report. She stated they needed us to wait before bringing pt as they need to check on neg. Pressure room on their floor as he is on airborn for TB rule out.

## 2013-06-27 NOTE — Consult Note (Signed)
[facility]     Reason for Consult: RUL cavitary pneumonia, HIV Ab screen positive    Referring Physician: Dr. Molli KnockYacoub  Active Problems:   Acute renal failure   Cavitary pneumonia   Lung mass   Hyperkalemia   . cefTRIAXone (ROCEPHIN)  IV  2 g Intravenous Q24H  . sodium polystyrene  30 g Oral Once  . tuberculin  5 Units Intradermal Once    Recommendations: Sputum for AFB x 3, I don't feel bronchoscopy needed at this time if we can get good sputum samples Sputum for bacteria and fungal Fungal antibiodies Histo Ab in urine  HIV viral load and CD4 while awaiting confirmation  Assessment: He has a new HIV Ab screen positive with confirmation pending.  Also with RUL cavitary area.  DDx: fungal, baceterial, AFB.  Need to r/o Tb with 3 smears 8 hours apart.  If any difficulty, will need FOB.  I DID NOT DISCUSS HIV WITH RELATIVELY HIGH FALSE POSITIVES.  Will wait for confirmation.  Antibiotics:  ceftriaxone day 2 Azithromycin x 1  HPI: David Fry is a 58 y[place]o. [place] recently [place] here with no known pmh who presented 3/23 with DOE, weakness, fatigue.  No previous medical problems he endorses.  Noted to have cavitary lung lesion on CT in RUL, acute renal failure.  Work up also revealed positive HIV Ab, confirmation pending.  Extensive travel, no known exposure.  Does know PPD negative in past.  No hemoptysis.  + smoker.  + colostomy.     Review of Systems: A comprehensive review of systems was negative.  Past Medical History  Diagnosis Date  . Crush injury trunk 2008  . Colostomy status     History  Substance Use Topics  . Smoking status: Current Every Day Smoker -- 1.00 packs/day for 40 years    Types: Cigarettes  . Smokeless tobacco: Not on file  . Alcohol Use: Not on file    No family history on file. No Known Allergies  OBJECTIVE: Blood pressure 110/48, pulse 73, temperature 97.3 F (36.3 C), temperature source  Oral, resp. rate 14, height 6\' 5"  (1.956 m), weight 216 lb 0.8 oz (98 kg), SpO2 96.00%. General: awake, alert, nad Skin: no rash Lungs: CTA B Cor: RRR without m Abdomen: soft, nt Ext: no edema  Microbiology: Recent Results (from the past 240 hour(s))  URINE CULTURE     Status: None   Collection Time    06/26/13 12:13 PM      Result Value Ref Range Status   Specimen Description URINE, CLEAN CATCH   Final   Special Requests NONE   Final   Culture  Setup Time     Final   Value: 06/26/2013 16:42     Performed at [facility]   Colony Count     Final   Value: 1,000 COLONIES/ML     Performed at [facility]   Culture     Final   Value: INSIGNIFICANT GROWTH     Performed at [facility]   Report Status 06/27/2013 FINAL   Final  MRSA PCR SCREENING     Status: None   Collection Time    06/26/13  7:43 PM      Result Value Ref Range Status   MRSA by PCR NEGATIVE  NEGATIVE Final   Comment:            The GeneXpert MRSA Assay (FDA  approved for NASAL specimens     only), is one component of a     comprehensive MRSA colonization     surveillance program. It is not     intended to diagnose MRSA     infection nor to guide or     monitor treatment for     MRSA infections.    Staci Righter, MD [facility] [email or web address] [phone] pager  [phone] 06/27/2013, 2:35 PM

## 2013-06-28 DIAGNOSIS — I82409 Acute embolism and thrombosis of unspecified deep veins of unspecified lower extremity: Secondary | ICD-10-CM

## 2013-06-28 DIAGNOSIS — B20 Human immunodeficiency virus [HIV] disease: Secondary | ICD-10-CM

## 2013-06-28 DIAGNOSIS — Z21 Asymptomatic human immunodeficiency virus [HIV] infection status: Secondary | ICD-10-CM

## 2013-06-28 LAB — HEPARIN LEVEL (UNFRACTIONATED)
Heparin Unfractionated: 0.19 IU/mL — ABNORMAL LOW (ref 0.30–0.70)
Heparin Unfractionated: <0.1 IU/mL — ABNORMAL LOW (ref 0.30–0.70)
Heparin Unfractionated: 0.18 IU/mL — ABNORMAL LOW (ref 0.30–0.70)

## 2013-06-28 LAB — CRYPTOCOCCAL ANTIGEN: Crypto Ag: NEGATIVE

## 2013-06-28 LAB — CBC
HEMATOCRIT: 30.2 % — AB (ref 39.0–52.0)
HEMOGLOBIN: 9.8 g/dL — AB (ref 13.0–17.0)
MCH: 25.3 pg — AB (ref 26.0–34.0)
MCHC: 32.5 g/dL (ref 30.0–36.0)
MCV: 77.8 fL — ABNORMAL LOW (ref 78.0–100.0)
Platelets: 164 10*3/uL (ref 150–400)
RBC: 3.88 MIL/uL — ABNORMAL LOW (ref 4.22–5.81)
RDW: 16.9 % — AB (ref 11.5–15.5)
WBC: 3.9 10*3/uL — ABNORMAL LOW (ref 4.0–10.5)

## 2013-06-28 LAB — BASIC METABOLIC PANEL
BUN: 41 mg/dL — AB (ref 6–23)
CALCIUM: 8.2 mg/dL — AB (ref 8.4–10.5)
CO2: 18 mEq/L — ABNORMAL LOW (ref 19–32)
CREATININE: 1.47 mg/dL — AB (ref 0.50–1.35)
Chloride: 100 mEq/L (ref 96–112)
GFR, EST AFRICAN AMERICAN: 60 mL/min — AB (ref >90)
GFR, EST NON AFRICAN AMERICAN: 52 mL/min — AB (ref >90)
GLUCOSE: 84 mg/dL (ref 70–99)
Potassium: 5 mEq/L (ref 3.7–5.3)
Sodium: 131 mEq/L — ABNORMAL LOW (ref 137–147)

## 2013-06-28 LAB — T-HELPER CELLS (CD4) COUNT (NOT AT ARMC)
CD4 % Helper T Cell: 10 % — ABNORMAL LOW (ref 33–55)
CD4 T Cell Abs: 50 /uL — ABNORMAL LOW (ref 400–2700)

## 2013-06-28 LAB — HIV 1/2 CONFIRMATION
HIV 1 ANTIBODY: POSITIVE
HIV 2 AB: NEGATIVE

## 2013-06-28 LAB — PHOSPHORUS: Phosphorus: 3 mg/dL (ref 2.3–4.6)

## 2013-06-28 LAB — MAGNESIUM: Magnesium: 1.5 mg/dL (ref 1.5–2.5)

## 2013-06-28 MED ORDER — HEPARIN BOLUS VIA INFUSION
3000.0000 [IU] | Freq: Once | INTRAVENOUS | Status: AC
  Administered 2013-06-28: 3000 [IU] via INTRAVENOUS
  Filled 2013-06-28: qty 3000

## 2013-06-28 MED ORDER — HEPARIN BOLUS VIA INFUSION
3000.0000 [IU] | Freq: Once | INTRAVENOUS | Status: AC
Start: 2013-06-28 — End: 2013-06-28
  Administered 2013-06-28: 3000 [IU] via INTRAVENOUS
  Filled 2013-06-28: qty 3000

## 2013-06-28 MED ORDER — HEPARIN (PORCINE) IN NACL 100-0.45 UNIT/ML-% IJ SOLN
2500.0000 [IU]/h | INTRAMUSCULAR | Status: DC
  Administered 2013-06-28: 2500 [IU]/h via INTRAVENOUS
  Administered 2013-06-28: 2200 [IU]/h via INTRAVENOUS
  Filled 2013-06-28 (×5): qty 250

## 2013-06-28 NOTE — Progress Notes (Signed)
ANTICOAGULATION CONSULT NOTE - Follow Up Consult  Pharmacy Consult for heparin  Indication: DVT  No Known Allergies  Patient Measurements: Height: 6\' 5"  (195.6 cm) Weight: 214 lb 8.1 oz (97.3 kg) IBW/kg (Calculated) : 89.1 Heparin Dosing Weight: 98 kg  Vital Signs: Temp: 99.4 F (37.4 C) (03/25 1014) Temp src: Oral (03/25 1014) BP: 103/51 mmHg (03/25 1014) Pulse Rate: 78 (03/25 1014)  Labs:  Recent Labs  06/26/13 0741 06/26/13 1133 06/26/13 1135 06/26/13 1825 06/27/13 0006 06/27/13 0410 06/27/13 1830 06/28/13 0330  HGB 14.6  --   --   --   --  10.6*  --  9.8*  HCT 43.2  --   --   --   --  32.1*  --  30.2*  PLT 265  --   --   --   --  166  --  164  HEPARINUNFRC  --   --   --   --   --   --  <0.10* 0.19*  CREATININE 3.50*  --  2.92*  --   --  1.84*  --  1.47*  TROPONINI  --  <0.30  --  <0.30 <0.30  --   --   --     Estimated Creatinine Clearance: 71.6 ml/min (by C-G formula based on Cr of 1.47).  Assessment: Patient is a 58 y.o M on heparin for new DVT with plan for for FOB for TB work-up.  Heparin level remains undetectable despite rate increased to 1900 units/hr this morning.  Goal of Therapy:  Heparin level 0.3-0.7 units/ml Monitor platelets by anticoagulation protocol: Yes   Plan:  1) heparin 3000 units IV x1 bolus, then increase drip to 2200 units/hr 2) check 6 hour heparin level  Jeancarlos Marchena P 06/28/2013,12:41 PM

## 2013-06-28 NOTE — Consult Note (Addendum)
WOC ostomy follow up Stoma type/location: Current pouch leaking.  Pt is reluctant to change despite describing how this will contribute to additional peristomal skin breakdown. Convinced to change with WOC assistance.  Skin appearance is slightly improved from previous consult yesterday.   Stomal assessment/size: Stoma red and viable, above skin level, 11/2 inches.  Peristomal assessment: Treatment options for stomal/peristomal skin:  Peristomal area with full thickness open wounds which are red and moist.  Fistula location is in wound at approx 3:00 o'clock to peristomal skin.  Mod amt green liquid draining from site. Output Pouch has 350cc green liquid Ostomy pouching: 2pc.  Education provided: Pt declines offer of using barrier ring to maintain seal, or high output pouches with spout to bedside drainage bag.  He wants to continue with his current method of using ostomy paste and today he requested a flat one piece pouch instead of a one piece convex.  Yesterday he requested a 2 piece convex pouch and there are none available at [facility] formulary and all available free samples left yesterday have been used. Applied flat one piece pouch and paste over stoma and peristomal fistula site as pt requests.  He is emptying into a cannister at the bedside to avoid over-filling and subsequent leakage.  Extra supplies at bedside for patient or staff use. He denies further questions or need for assistance at this time. Previous pouch had medipore tape applied around border which is very painful to remove.  Avoid use of tape if possible.  Left pink tape at bedside which is gentle to the skin if used. Please re-consult if further assistance is needed.  Thank-you,  Cammie Mcgeeawn Jolisa Intriago MSN, RN, CWOCN, [place], CNS [phone]

## 2013-06-28 NOTE — Progress Notes (Signed)
ANTICOAGULATION CONSULT NOTE - Follow Up Consult  Pharmacy Consult for heparin  Indication: DVT  No Known Allergies  Patient Measurements: Height: 6\' 5"  (195.6 cm) Weight: 214 lb 8.1 oz (97.3 kg) IBW/kg (Calculated) : 89.1 Heparin Dosing Weight: 98 kg  Vital Signs: Temp: 98.2 F (36.8 C) (03/25 1858) Temp src: Oral (03/25 1858) BP: 118/58 mmHg (03/25 1858) Pulse Rate: 79 (03/25 1858)  Labs:  Recent Labs  06/26/13 0741 06/26/13 1133 06/26/13 1135 06/26/13 1825 06/27/13 0006 06/27/13 0410  06/28/13 0330 06/28/13 1252 06/28/13 1938  HGB 14.6  --   --   --   --  10.6*  --  9.8*  --   --   HCT 43.2  --   --   --   --  32.1*  --  30.2*  --   --   PLT 265  --   --   --   --  166  --  164  --   --   HEPARINUNFRC  --   --   --   --   --   --   < > 0.19* <0.10* 0.18*  CREATININE 3.50*  --  2.92*  --   --  1.84*  --  1.47*  --   --   TROPONINI  --  <0.30  --  <0.30 <0.30  --   --   --   --   --   < > = values in this interval not displayed.  Estimated Creatinine Clearance: 71.6 ml/min (by C-G formula based on Cr of 1.47).  Assessment: Patient is a 58 y.o M on heparin for new DVT. Heparin level still subtherapeutic (0.18) but trending up past increase this afternoon.  Goal of Therapy:  Heparin level 0.3-0.7 units/ml Monitor platelets by anticoagulation protocol: Yes   Plan:  1) Rebolus heparin 3000 units IV then increase drip to 2500 units/hr 2) Check 6 hour heparin level  Christoper Fabianaron Duaa Stelzner, PharmD, BCPS Clinical pharmacist, pager [phone] 06/28/2013,8:48 PM

## 2013-06-28 NOTE — Progress Notes (Signed)
PULMONARY / CRITICAL CARE MEDICINE  Name: David Fry MRN: [id number] DOB: [birth year]    ADMISSION DATE:  06/26/2013  REFERRING MD :  EDP  PRIMARY SERVICE: PCCM   CHIEF COMPLAINT:  Dyspnea, acute renal failure   BRIEF PATIENT DESCRIPTION: 58 yo active smoker without significant medical history [place] presented 3/23 with 2 weeks of dyspnea, weakness and fatigue.  In ER found to be in acute renal failure with hyperkalemia, hypotensive, with RUL cavitary infiltrate.   SIGNIFICANT EVENTS / STUDIES:  3/23  CT chest >>> cavitary masslike area in the right upper lobe measuring up to 9.5 cm with surrounding extensive airspace consolidation. 3/23  VQ scan  >>> Low probability of PE 3/23  BLE dopplers >>> DVT left peroneal vein.  3/23  Renal US >>> Unremarkable  LINES / TUBES:  CULTURES: 3/23  Urine >>> neg 3/23  Urine Strep Ag >>> neg 3/23  Urine Legionella Ag  >>> neg  ANTIBIOTICS: Rocephin 3/23 >>> Azithrimycin 3/23 >>>  INTERVAL HISTORY: Hypotension overnight and continued fevers. Currently normothermic.   VITAL SIGNS: Temp:  [97.3 F (36.3 C)-101.1 F (38.4 C)] 98.9 F (37.2 C) (03/25 0510) Pulse Rate:  [56-86] 74 (03/25 0510) Resp:  [14-24] 20 (03/25 0510) BP: (77-110)/(39-56) 103/52 mmHg (03/25 0510) SpO2:  [95 %-100 %] 98 % (03/25 0510) Weight:  [97.3 kg (214 lb 8.1 oz)] 97.3 kg (214 lb 8.1 oz) (03/25 0510)  HEMODYNAMICS:   VENTILATOR SETTINGS:   INTAKE / OUTPUT: Intake/Output     03/24 0701 - 03/25 0700 03/25 0701 - 03/26 0700   P.O.     I.V. (mL/kg)     IV Piggyback     Total Intake(mL/kg)     Urine (mL/kg/hr) 950 (0.4)    Stool 2100 (0.9)    Total Output 3050     Net -3050           PHYSICAL EXAMINATION: General:  Pleasant male, NAD appears older than stated age Neuro:  Awake, alert, appropriate HEENT:  Mm moist, no JVD  Cardiovascular:  s1s2 rrr, intermittent tachy Lungs:  resps even, non labored on [place], slightly  diminished R, otherwise essentially clear  Abdomen:  Soft, +bs, colostomy Musculoskeletal:  Warm and dry, no sig edema   LABS:  CBC  Recent Labs Lab 06/26/13 0741 06/27/13 0410 06/28/13 0330  WBC 7.5 4.0 3.9*  HGB 14.6 10.6* 9.8*  HCT 43.2 32.1* 30.2*  PLT 265 166 164   Coag's No results found for this basename: APTT, INR,  in the last 168 hours BMET  Recent Labs Lab 06/26/13 1135 06/27/13 0410 06/28/13 0330  NA 128* 132* 131*  K 5.8* 5.5* 5.0  CL 96 102 100  CO2 16* 17* 18*  BUN 102* 67* 41*  CREATININE 2.92* 1.84* 1.47*  GLUCOSE 107* 74 84   Electrolytes  Recent Labs Lab 06/26/13 1135 06/27/13 0410 06/28/13 0330  CALCIUM 8.4 8.0* 8.2*  MG 1.9  --  1.5  PHOS 5.8*  --  3.0   Sepsis Markers  Recent Labs Lab 06/26/13 1133 06/27/13 0400  LATICACIDVEN  --  1.3  PROCALCITON 0.27  --    ABG No results found for this basename: PHART, PCO2ART, PO2ART,  in the last 168 hours Liver Enzymes No results found for this basename: AST, ALT, ALKPHOS, BILITOT, ALBUMIN,  in the last 168 hours Cardiac Enzymes  Recent Labs Lab 06/26/13 0741 06/26/13 1133 06/26/13 1825 06/27/13 0006  TROPONINI  --  <0.30 <  0.30 <0.30  PROBNP 936.1*  --   --   --    Glucose No results found for this basename: GLUCAP,  in the last 168 hours  Imaging Ct Chest Wo Contrast  06/26/2013   CLINICAL DATA:  shortness of breath.  Abnormal chest x-ray.  EXAM: CT CHEST WITHOUT CONTRAST  TECHNIQUE: Multidetector CT imaging of the chest was performed following the standard protocol without IV contrast.  COMPARISON:  06/26/2013  FINDINGS: Abnormal area of airspace consolidation with associated large area of cavitation noted in the right upper lobe. The cavitary area measures approximately 9.5 x 4.9 cm on image 16. Extensive surrounding consolidation/airspace disease. Differential considerations include cavitary neoplasm or area of a infection/pneumonia with abscess formation. Tuberculosis is a  possibility.  No other areas of consolidation. No pleural effusions. No mediastinal visible hilar or axillary adenopathy. No supraclavicular adenopathy.  Chest wall soft tissues are unremarkable. Imaging into the upper abdomen shows no acute findings.  IMPRESSION: Large cavitary masslike area in the right upper lobe measuring up to 9.5 cm with surrounding extensive airspace consolidation. Differential considerations would include cavitary neoplasm or pneumonia. Tuberculosis is not excluded. Recommend clinical correlation. No associated effusion or adenopathy.   Electronically Signed   By: Charlett Nose M.D.   On: 06/26/2013 11:45   US Renal  06/26/2013   CLINICAL DATA:  Renal failure.  EXAM: RENAL/URINARY TRACT ULTRASOUND COMPLETE  COMPARISON:  None.  FINDINGS: Right Kidney:  Length: 12.8 cm. Echogenicity within normal limits. No mass or hydronephrosis visualized.  Left Kidney:  Length: 11.5 cm. Echogenicity within normal limits. No mass or hydronephrosis visualized.  Bladder:  Appears normal for degree of bladder distention.  IMPRESSION: Unremarkable renal ultrasound.   Electronically Signed   By: Charlett Nose M.D.   On: 06/26/2013 12:32   Nm Pulmonary Perf And Vent  06/26/2013   CLINICAL DATA:  58 year old male with shortness of breath.  EXAM: NUCLEAR MEDICINE VENTILATION - PERFUSION LUNG SCAN  TECHNIQUE: Ventilation images were obtained in multiple projections using inhaled aerosol technetium 99 M DTPA. Perfusion images were obtained in multiple projections after intravenous injection of Tc-51m MAA.  RADIOPHARMACEUTICALS:  40 mCi Tc-44m DTPA aerosol and 6 mCi Tc-44m MAA  COMPARISON:  06/26/2013 chest radiograph and CT  FINDINGS: Ventilation: Decreased ventilation within the majority of the right upper lobe noted. No other ventilation abnormalities are noted.  Perfusion: Decreased perfusion within the majority of right upper lobe identified which matches the ventilation defect. No perfusion-ventilation  mismatches are present.  IMPRESSION: Low probability for pulmonary embolus (less than 20%).   Electronically Signed   By: Laveda Abbe M.D.   On: 06/26/2013 17:14   Dg Chest Port 1 View  06/27/2013   CLINICAL DATA:  Shortness of breath and cough.  EXAM: PORTABLE CHEST - 1 VIEW  COMPARISON:  Chest x-ray 06/26/2013.  FINDINGS: There continues to be an area of airspace consolidation and cavitation in the right upper lobe, similar to prior chest radiograph 06/26/2013, although the extent of airspace consolidation appears slightly increased. Lungs are otherwise clear. No pleural effusions. No evidence of pulmonary edema. Heart size and mediastinal contours are unremarkable.  IMPRESSION: 1. Large area of airspace consolidation and cavitation in the right upper lobe redemonstrated. Findings remain highly concerning for atypical infection, including mycobacterium tuberculosis infection, as previously described. Clinical correlation and consideration for respiratory isolation and sputum sampling is recommended.   Electronically Signed   By: Trudie Reed M.D.   On: 06/27/2013 06:26  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)  ID following  Awaiting confirmatory test before discussing with patient  Likely needs HAART  RUL cavitary lesion in immunocompromised host ( bacterial, fungal, AFB, less likely malignancy )  Ceftriaxone  Follow PPD  Quantiferon gold  FOB in am, BAL only  Would avoid transbronchial bx at this time ( close to pleural surface - risk of pneumothorax ) and on Heparin gtt for  DVT ( risk of hemorrhage )  LLE DVT PE unlikely ruled out  Heparin gtt, hold in am for FOB  Dehydration / hypovolemia Acute renal failure, improving Hyperkalemia, resolved P:    Trend BMP  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  Lonia Farber, MD Pulmonary and Critical Care Medicine [facility] Pager: [phone]  06/28/2013, 1:26 PM

## 2013-06-28 NOTE — Progress Notes (Signed)
ANTICOAGULATION CONSULT NOTE  Pharmacy Consult for heparin Indication: DVT  No Known Allergies  Patient Measurements: Height: 6\' 5"  (195.6 cm) Weight: 214 lb 8.1 oz (97.3 kg) IBW/kg (Calculated) : 89.1 Heparin Dosing Weight: 98kg  Vital Signs: Temp: 98.9 F (37.2 C) (03/25 0510) Temp src: Oral (03/25 0510) BP: 103/52 mmHg (03/25 0510) Pulse Rate: 74 (03/25 0510)  Labs:  Recent Labs  06/26/13 0741 06/26/13 1133 06/26/13 1135 06/26/13 1825 06/27/13 0006 06/27/13 0410 06/27/13 1830 06/28/13 0330  HGB 14.6  --   --   --   --  10.6*  --  9.8*  HCT 43.2  --   --   --   --  32.1*  --  30.2*  PLT 265  --   --   --   --  166  --  164  HEPARINUNFRC  --   --   --   --   --   --  <0.10* 0.19*  CREATININE 3.50*  --  2.92*  --   --  1.84*  --   --   TROPONINI  --  <0.30  --  <0.30 <0.30  --   --   --     Estimated Creatinine Clearance: 57.2 ml/min (by C-G formula based on Cr of 1.84).   Medical History: Past Medical History  Diagnosis Date  . Crush injury trunk 2008  . Colostomy status     Medications:  Infusions:  . heparin 1,700 Units/hr (06/28/13 0233)  . heparin      Assessment: 57 yom presented to the hospital with SOB and ARF. Now to start IV heparin for DVT. VQ scan was reported with low probability of PE. H/H and plts have already dropped from admission so will need to watch closely with full anticoagulation. He was not on anticoagulation PTA. Pt did already receive a dose of SQ heparin this AM. Heparin level remains subtherapeutic at 0.19 units/ml   Goal of Therapy:  Heparin level 0.3-0.7 units/ml Monitor platelets by anticoagulation protocol: Yes   Plan:  1. Heparin bolus 3000 units IV x 1 2. Heparin gtt to 1900 units/hr 3. Check a 6 hour heparin level 4. Daily heparin level and CBC 5. F/u plans for oral anticoagulation  David Fry 06/28/2013,5:31 AM

## 2013-06-28 NOTE — Progress Notes (Addendum)
[facility]  Date of Admission:  06/26/2013  Antibiotics:  Subjective: No acute events  Objective: Temp:  [97.3 F (36.3 C)-101.1 F (38.4 C)] 98.9 F (37.2 C) (03/25 0510) Pulse Rate:  [56-86] 74 (03/25 0510) Resp:  [14-24] 20 (03/25 0510) BP: (77-110)/(39-56) 103/52 mmHg (03/25 0510) SpO2:  [95 %-100 %] 98 % (03/25 0510) Weight:  [214 lb 8.1 oz (97.3 kg)] 214 lb 8.1 oz (97.3 kg) (03/25 0510)   Gen: nad GI: + colostomy  Lab Results Lab Results  Component Value Date   WBC 3.9* 06/28/2013   HGB 9.8* 06/28/2013   HCT 30.2* 06/28/2013   MCV 77.8* 06/28/2013   PLT 164 06/28/2013    Lab Results  Component Value Date   CREATININE 1.47* 06/28/2013   BUN 41* 06/28/2013   NA 131* 06/28/2013   K 5.0 06/28/2013   CL 100 06/28/2013   CO2 18* 06/28/2013    No results found for this basename: ALT, AST, GGT, ALKPHOS, BILITOT      Microbiology: Recent Results (from the past 240 hour(s))  URINE CULTURE     Status: None   Collection Time    06/26/13 12:13 PM      Result Value Ref Range Status   Specimen Description URINE, CLEAN CATCH   Final   Special Requests NONE   Final   Culture  Setup Time     Final   Value: 06/26/2013 16:42     Performed at [facility] Count     Final   Value: 1,000 COLONIES/ML     Performed at [facility]   Culture     Final   Value: INSIGNIFICANT GROWTH     Performed at [facility]   Report Status 06/27/2013 FINAL   Final  MRSA PCR SCREENING     Status: None   Collection Time    06/26/13  7:43 PM      Result Value Ref Range Status   MRSA by PCR NEGATIVE  NEGATIVE Final   Comment:            The GeneXpert MRSA Assay (FDA     approved for NASAL specimens     only), is one component of a     comprehensive MRSA colonization     surveillance program. It is not     intended to diagnose MRSA     infection nor to guide or     monitor treatment for     MRSA infections.     Studies/Results: Ct Chest Wo Contrast  06/26/2013   CLINICAL DATA:  shortness of breath.  Abnormal chest x-ray.  EXAM: CT CHEST WITHOUT CONTRAST  TECHNIQUE: Multidetector CT imaging of the chest was performed following the standard protocol without IV contrast.  COMPARISON:  06/26/2013  FINDINGS: Abnormal area of airspace consolidation with associated large area of cavitation noted in the right upper lobe. The cavitary area measures approximately 9.5 x 4.9 cm on image 16. Extensive surrounding consolidation/airspace disease. Differential considerations include cavitary neoplasm or area of a infection/pneumonia with abscess formation. Tuberculosis is a possibility.  No other areas of consolidation. No pleural effusions. No mediastinal visible hilar or axillary adenopathy. No supraclavicular adenopathy.  Chest wall soft tissues are unremarkable. Imaging into the upper abdomen shows no acute findings.  IMPRESSION: Large cavitary masslike area in the right upper lobe measuring up to 9.5 cm with surrounding extensive airspace consolidation. Differential considerations would include cavitary  neoplasm or pneumonia. Tuberculosis is not excluded. Recommend clinical correlation. No associated effusion or adenopathy.   Electronically Signed   By: Charlett NoseKevin  Dover M.D.   On: 06/26/2013 11:45   [place] Renal  06/26/2013   CLINICAL DATA:  Renal failure.  EXAM: RENAL/URINARY TRACT ULTRASOUND COMPLETE  COMPARISON:  None.  FINDINGS: Right Kidney:  Length: 12.8 cm. Echogenicity within normal limits. No mass or hydronephrosis visualized.  Left Kidney:  Length: 11.5 cm. Echogenicity within normal limits. No mass or hydronephrosis visualized.  Bladder:  Appears normal for degree of bladder distention.  IMPRESSION: Unremarkable renal ultrasound.   Electronically Signed   By: Charlett NoseKevin  Dover M.D.   On: 06/26/2013 12:32   Nm Pulmonary Perf And Vent  06/26/2013   CLINICAL DATA:  58 year old male with shortness of breath.  EXAM: NUCLEAR  MEDICINE VENTILATION - PERFUSION LUNG SCAN  TECHNIQUE: Ventilation images were obtained in multiple projections using inhaled aerosol technetium 99 M DTPA. Perfusion images were obtained in multiple projections after intravenous injection of Tc-6825m MAA.  RADIOPHARMACEUTICALS:  40 mCi Tc-8225m DTPA aerosol and 6 mCi Tc-7225m MAA  COMPARISON:  06/26/2013 chest radiograph and CT  FINDINGS: Ventilation: Decreased ventilation within the majority of the right upper lobe noted. No other ventilation abnormalities are noted.  Perfusion: Decreased perfusion within the majority of right upper lobe identified which matches the ventilation defect. No perfusion-ventilation mismatches are present.  IMPRESSION: Low probability for pulmonary embolus (less than 20%).   Electronically Signed   By: Laveda AbbeJeff  Hu M.D.   On: 06/26/2013 17:14   Dg Chest Port 1 View  06/27/2013   CLINICAL DATA:  Shortness of breath and cough.  EXAM: PORTABLE CHEST - 1 VIEW  COMPARISON:  Chest x-ray 06/26/2013.  FINDINGS: There continues to be an area of airspace consolidation and cavitation in the right upper lobe, similar to prior chest radiograph 06/26/2013, although the extent of airspace consolidation appears slightly increased. Lungs are otherwise clear. No pleural effusions. No evidence of pulmonary edema. Heart size and mediastinal contours are unremarkable.  IMPRESSION: 1. Large area of airspace consolidation and cavitation in the right upper lobe redemonstrated. Findings remain highly concerning for atypical infection, including mycobacterium tuberculosis infection, as previously described. Clinical correlation and consideration for respiratory isolation and sputum sampling is recommended.   Electronically Signed   By: Trudie Reedaniel  Entrikin M.D.   On: 06/27/2013 06:26    Assessment/Plan: 1)  RUL cavitary lesion - he has not been able to get sputum for AFB and other tests.  It seems that FOB then will be necessary to get ID of RUL lesion.    2) HIV  screen positive - awaiting confirmation, have not discussed yet with patient until confirmed.   -CD4 noted and is 50  David Fry, ROBERT, MD [facility] [email or web address] [phone] pager   [phone] 06/28/2013, 9:47 AM

## 2013-06-29 ENCOUNTER — Inpatient Hospital Stay (HOSPITAL_COMMUNITY)

## 2013-06-29 ENCOUNTER — Encounter (HOSPITAL_COMMUNITY)
Admission: EM | Disposition: A | Discharge status: Home / Self Care | Payer: Self-pay | Source: Home / Self Care | Attending: Pulmonary Disease

## 2013-06-29 DIAGNOSIS — A159 Respiratory tuberculosis unspecified: Secondary | ICD-10-CM

## 2013-06-29 DIAGNOSIS — B20 Human immunodeficiency virus [HIV] disease: Secondary | ICD-10-CM

## 2013-06-29 HISTORY — DX: Respiratory tuberculosis unspecified: A15.9

## 2013-06-29 HISTORY — DX: Human immunodeficiency virus (HIV) disease: B20

## 2013-06-29 HISTORY — PX: VIDEO BRONCHOSCOPY: SHX5072

## 2013-06-29 LAB — HEPARIN LEVEL (UNFRACTIONATED)
Heparin Unfractionated: 0.59 IU/mL (ref 0.30–0.70)
Heparin Unfractionated: <0.1 IU/mL — ABNORMAL LOW (ref 0.30–0.70)

## 2013-06-29 LAB — BASIC METABOLIC PANEL
BUN: 27 mg/dL — ABNORMAL HIGH (ref 6–23)
CO2: 16 meq/L — AB (ref 19–32)
Calcium: 8.5 mg/dL (ref 8.4–10.5)
Chloride: 98 mEq/L (ref 96–112)
Creatinine, Ser: 1.37 mg/dL — ABNORMAL HIGH (ref 0.50–1.35)
GFR calc Af Amer: 66 mL/min — ABNORMAL LOW (ref >90)
GFR, EST NON AFRICAN AMERICAN: 57 mL/min — AB (ref >90)
Glucose, Bld: 75 mg/dL (ref 70–99)
Potassium: 5 mEq/L (ref 3.7–5.3)
SODIUM: 128 meq/L — AB (ref 137–147)

## 2013-06-29 LAB — CBC
HCT: 30.8 % — ABNORMAL LOW (ref 39.0–52.0)
Hemoglobin: 10 g/dL — ABNORMAL LOW (ref 13.0–17.0)
MCH: 25.3 pg — ABNORMAL LOW (ref 26.0–34.0)
MCHC: 32.5 g/dL (ref 30.0–36.0)
MCV: 77.8 fL — AB (ref 78.0–100.0)
PLATELETS: 159 10*3/uL (ref 150–400)
RBC: 3.96 MIL/uL — ABNORMAL LOW (ref 4.22–5.81)
RDW: 16.7 % — AB (ref 11.5–15.5)
WBC: 4 10*3/uL (ref 4.0–10.5)

## 2013-06-29 SURGERY — BRONCHOSCOPY, WITH FLUOROSCOPY
Anesthesia: Moderate Sedation | Laterality: Bilateral

## 2013-06-29 MED ORDER — MIDAZOLAM HCL 10 MG/2ML IJ SOLN
INTRAMUSCULAR | Status: DC | PRN
  Administered 2013-06-29 (×2): 2 mg via INTRAVENOUS

## 2013-06-29 MED ORDER — LIDOCAINE HCL 1 % IJ SOLN
INTRAMUSCULAR | Status: DC | PRN
Start: 2013-06-29 — End: 2013-06-29
  Administered 2013-06-29: 6 mL

## 2013-06-29 MED ORDER — FENTANYL CITRATE 0.05 MG/ML IJ SOLN
INTRAMUSCULAR | Status: AC
  Filled 2013-06-29: qty 4

## 2013-06-29 MED ORDER — MIDAZOLAM HCL 5 MG/ML IJ SOLN
INTRAMUSCULAR | Status: AC
  Filled 2013-06-29: qty 2

## 2013-06-29 MED ORDER — SODIUM CHLORIDE 0.9 % IV SOLN
INTRAVENOUS | Status: DC
  Administered 2013-06-29: 14:00:00 via INTRAVENOUS

## 2013-06-29 MED ORDER — FENTANYL CITRATE 0.05 MG/ML IJ SOLN
INTRAMUSCULAR | Status: AC
  Filled 2013-06-29: qty 2

## 2013-06-29 MED ORDER — LIDOCAINE HCL 2 % EX GEL
CUTANEOUS | Status: DC | PRN
  Administered 2013-06-29: 1

## 2013-06-29 MED ORDER — MIDAZOLAM HCL 5 MG/ML IJ SOLN
INTRAMUSCULAR | Status: AC
  Filled 2013-06-29: qty 1

## 2013-06-29 MED ORDER — FENTANYL CITRATE 0.05 MG/ML IJ SOLN
INTRAMUSCULAR | Status: DC | PRN
  Administered 2013-06-29 (×2): 50 ug via INTRAVENOUS

## 2013-06-29 MED ORDER — HEPARIN (PORCINE) IN NACL 100-0.45 UNIT/ML-% IJ SOLN
2600.0000 [IU]/h | INTRAMUSCULAR | Status: DC
  Administered 2013-06-29: 2500 [IU]/h via INTRAVENOUS
  Administered 2013-06-30 – 2013-07-01 (×3): 2600 [IU]/h via INTRAVENOUS
  Filled 2013-06-29 (×6): qty 250

## 2013-06-29 MED ORDER — INFLUENZA VAC SPLIT QUAD 0.5 ML IM SUSP
0.5000 mL | INTRAMUSCULAR | Status: AC
  Administered 2013-06-30: 0.5 mL via INTRAMUSCULAR
  Filled 2013-06-29: qty 0.5

## 2013-06-29 MED ORDER — PHENYLEPHRINE HCL 0.25 % NA SOLN
NASAL | Status: DC | PRN
  Administered 2013-06-29: 2 via NASAL

## 2013-06-29 NOTE — Progress Notes (Signed)
ANTICOAGULATION CONSULT NOTE - Follow Up Consult  Pharmacy Consult for heparin  Indication: DVT  No Known Allergies  Patient Measurements: Height: 6\' 5"  (195.6 cm) Weight: 213 lb 11.2 oz (96.934 kg) IBW/kg (Calculated) : 89.1 Heparin Dosing Weight: 98 kg  Vital Signs: Temp: 99.2 F (37.3 C) (03/25 2110) Temp src: Oral (03/25 2110) BP: 94/55 mmHg (03/25 2110) Pulse Rate: 69 (03/25 2110)  Labs:  Recent Labs  06/26/13 1133  06/26/13 1825 06/27/13 0006 06/27/13 0410  06/28/13 0330 06/28/13 1252 06/28/13 1938 06/29/13 0456 06/29/13 0500  HGB  --   --   --   --  10.6*  --  9.8*  --   --   --  10.0*  HCT  --   --   --   --  32.1*  --  30.2*  --   --   --  30.8*  PLT  --   --   --   --  166  --  164  --   --   --  159  HEPARINUNFRC  --   --   --   --   --   < > 0.19* <0.10* 0.18* 0.59  --   CREATININE  --   < >  --   --  1.84*  --  1.47*  --   --   --  1.37*  TROPONINI <0.30  --  <0.30 <0.30  --   --   --   --   --   --   --   < > = values in this interval not displayed.  Estimated Creatinine Clearance: 76.8 ml/min (by C-G formula based on Cr of 1.37).  Assessment: Patient is a 58 y.o M on heparin for new DVT. Heparin level therapeutic.  Goal of Therapy:  Heparin level 0.3-0.7 units/ml Monitor platelets by anticoagulation protocol: Yes   Plan:  1) Cont heparin at 2500 units/hr 2) Check 6 hour heparin level to confirm Talbert CageLora D'Arcy Abraha, PharmD Clinical pharmacist, pager [phone] 06/29/2013,6:19 AM

## 2013-06-29 NOTE — Progress Notes (Signed)
Read patient's ppd on his right forearm. It was negative.

## 2013-06-29 NOTE — Progress Notes (Signed)
Heparin drip was stopped at 5am due to orders for Bronchoscopy today. Notified pharmacy of the medication being stop. Will continue to monitor and assess the patient.   PACCAR Incyanne Hill BSN , RN [facility] [address] [place] [phone]

## 2013-06-29 NOTE — Progress Notes (Signed)
Patient at bronchoscopy.  Unable to discuss positive western blot.  Will see patient in the am, follow results.  If AFB smear negative from bronchial washing, can d/c respiratory isolation.

## 2013-06-29 NOTE — Progress Notes (Signed)
Video Bronchoscopy done Intervention Bronchial washing Procedure tolerated well 

## 2013-06-29 NOTE — Progress Notes (Addendum)
ANTICOAGULATION CONSULT NOTE - Follow Up Consult  Pharmacy Consult for heparin  Indication: DVT  No Known Allergies  Patient Measurements: Height: 6\' 5"  (195.6 cm) Weight: 213 lb 11.2 oz (96.934 kg) IBW/kg (Calculated) : 89.1 Heparin Dosing Weight: 98 kg  Vital Signs: Temp: 98.4 F (36.9 C) (03/26 1700) Temp src: Oral (03/26 1700) BP: 89/46 mmHg (03/26 1700) Pulse Rate: 74 (03/26 1700)  Labs:  Recent Labs  06/27/13 0006  06/27/13 0410  06/28/13 0330  06/28/13 1938 06/29/13 0456 06/29/13 0500 06/29/13 1208  HGB  --   < > 10.6*  --  9.8*  --   --   --  10.0*  --   HCT  --   --  32.1*  --  30.2*  --   --   --  30.8*  --   PLT  --   --  166  --  164  --   --   --  159  --   HEPARINUNFRC  --   --   --   < > 0.19*  < > 0.18* 0.59  --  <0.10*  CREATININE  --   --  1.84*  --  1.47*  --   --   --  1.37*  --   TROPONINI <0.30  --   --   --   --   --   --   --   --   --   < > = values in this interval not displayed.  Estimated Creatinine Clearance: 76.8 ml/min (by C-G formula based on Cr of 1.37).  Assessment: Patient is a 58 y.o M on heparin for new DVT. Heparin level therapeutic earlier today on 2500 units/hr. Heparin was off when noon level drawn as pt for bronch today. S/p bronch ~1530. Spoke with Dr. Vassie LollAlva who gave verbal order to restart heparin 6 hours post bronch with no bolus.  Goal of Therapy:  Heparin level 0.3-0.7 units/ml Monitor platelets by anticoagulation protocol: Yes   Plan:  1) Restart heparin 2500 units/hr at 2130. No bolus. 2) Daily heparin level and CBC  Christoper Fabianaron Amend, PharmD, BCPS Clinical pharmacist, pager [phone] 06/29/2013,7:27 PM  Addum:  Heparin level 0.26 units/ml.  Will increase heparin to 2600 units/hr and recheck level in 6 hours.

## 2013-06-29 NOTE — Procedures (Deleted)
Name:  David Fry MRN:  [id number] DOB:  05-Jan-1956  PROCEDURE NOTE  Procedure:   Flexible bronchoscopy [phone]) Bronchoalveolar lavage [phone]  Indications:  Cavitary lesion RUL  Consent:  Procedure, benefits, risks and alternatives discussed.  Questions answered.  Consent obtained.  Anesthesia:  Fentanyl / Versed IV + Lidocaine via bronchoscope   Procedure summary:  Appropriate equipment was assembled.  The patient was identified as David BarbaraJames Haughey.  Safety timeout was performed. The patient was placed supine and adequate level of sedation was assured.  Flexible bronchoscope was lubricated and inserted via the endotracheal tube.  Airway examination was performed bilaterally to subsegmental level.  Clear secretions were noted, mucosa appeared normal and no endobronchial lesions were identified.  Bronchial alveolar lavage of the RUL was performed with 90 mL of normal saline and return of 35 mL of fluid, after which the bronchoscope was withdrawn.  Specimens sent:  Bronchial alveolar lavage specimen of the bacterial / fungal / AFB cultures, fungal / AFB / PCP smear and cytology  Complications:  No immediate complications were noted.  Hemodynamic parameters and oxygenation remained stable throughout the procedure.  Estimated blood loss:  Less then 5 mL.  Orlean BradfordK. Kyana Aicher, M.D. Pulmonary and Critical Care Medicine [facility] Pager: [phone]  06/29/2013, 3:24 PM

## 2013-06-29 NOTE — Op Note (Signed)
Name:  David Fry Singleterry MRN:  [id number] DOB:  1955/08/06  PROCEDURE NOTE  Procedure:   Flexible bronchoscopy ([id number]) Bronchoalveolar lavage [id number])  Indications:  Cavitary lesion RUL  Consent:  Procedure, benefits, risks and alternatives discussed.  Questions answered.  Consent obtained.  Anesthesia:  Fentanyl / Versed IV + Lidocaine via bronchoscope   Procedure summary:  Appropriate equipment was assembled.  The patient was identified as David Fry Deist.  Safety timeout was performed. The patient was placed supine and adequate level of sedation was assured.  Flexible bronchoscope was lubricated and inserted via the endotracheal tube.  Airway examination was performed bilaterally to subsegmental level.  Clear secretions were noted, mucosa appeared normal and no endobronchial lesions were identified.  Bronchial alveolar lavage of the RUL was performed with 90 mL of normal saline and return of 35 mL of fluid, after which the bronchoscope was withdrawn.  Specimens sent:  Bronchial alveolar lavage specimen of the bacterial / fungal / AFB cultures, fungal / AFB / PCP smear and cytology  Complications:  No immediate complications were noted.  Hemodynamic parameters and oxygenation remained stable throughout the procedure.  Estimated blood loss:  Less then 5 mL.  Orlean BradfordK. Zarius Furr, M.D. Pulmonary and Critical Care Medicine [facility] Pager: [phone]  06/29/2013, 3:54 PM

## 2013-06-29 NOTE — Progress Notes (Signed)
Patient arrived from bronchoscopy without any distress. Patient is easily aroused. Vital signs are stable. Will continue to monitor for distress

## 2013-06-29 NOTE — Consult Note (Addendum)
WOC ostomy consult note Stoma type/location: Current pouch is leaking. Refer to previous progress notes for more detailed assessment. 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  Assisted pt with pouch change to areas.  No change in previous appearance of stoma or peristomal open wounds. Applied paste and one piece flat pouch at patient's request.  Pouch must be emptied Q hour R/T high output or it will leak when over-filled.  Supplies at bedside for pt and staff nurse use. Please re-consult if further assistance is needed.  Thank-you,  Cammie Mcgeeawn Vester Titsworth MSN, RN, CWOCN, [place]CN-AP, CNS [phone]

## 2013-06-29 NOTE — Progress Notes (Signed)
PULMONARY / CRITICAL CARE MEDICINE  Name: David Fry MRN: [id number] DOB: [birth year]    ADMISSION DATE:  06/26/2013  REFERRING MD :  EDP  PRIMARY SERVICE: PCCM   CHIEF COMPLAINT:  Dyspnea, acute renal failure   BRIEF PATIENT DESCRIPTION: 58 yo active smoker without significant medical history [place] presented 3/23 with 2 weeks of dyspnea, weakness and fatigue.  In ER found to be in acute renal failure with hyperkalemia, hypotensive, with RUL cavitary infiltrate.   SIGNIFICANT EVENTS / STUDIES:  3/23  CT chest >>> cavitary masslike area in the right upper lobe measuring up to 9.5 cm with surrounding extensive airspace consolidation. 3/23  VQ scan  >>> Low probability of PE 3/23  BLE dopplers >>> DVT left peroneal vein.  3/23  Renal US >>> Unremarkable  LINES / TUBES:  CULTURES: 3/23  Urine >>> neg 3/23  Urine Strep Ag >>> neg 3/23  Urine Legionella Ag  >>> neg  ANTIBIOTICS: Rocephin 3/23 >>> Azithrimycin 3/23 >>>  INTERVAL HISTORY: Hypotension overnight and continued fevers. Currently normothermic.   VITAL SIGNS: Temp:  [98.2 F (36.8 C)-99.8 F (37.7 C)] 99.8 F (37.7 C) (03/26 0812) Pulse Rate:  [68-79] 68 (03/26 0812) Resp:  [18-20] 18 (03/26 0812) BP: (88-118)/(42-58) 88/47 mmHg (03/26 0812) SpO2:  [98 %-100 %] 99 % (03/26 0812) Weight:  [96.934 kg (213 lb 11.2 oz)] 96.934 kg (213 lb 11.2 oz) (03/26 0416)  HEMODYNAMICS:   VENTILATOR SETTINGS:   INTAKE / OUTPUT: Intake/Output     03/25 0701 - 03/26 0700 03/26 0701 - 03/27 0700   P.O. 1200    Total Intake(mL/kg) 1200 (12.4)    Urine (mL/kg/hr) 550 (0.2)    Stool 1100 (0.5)    Total Output 1650     Net -450           PHYSICAL EXAMINATION: General:  No distress Neuro:  Awake, alert HEENT:  No JVD  Cardiovascular:  Regular, no murmurs Lungs: Bilateral air entry, no added sonds Abdomen:  Soft, colostomy appears viable Musculoskeletal:  No edema  LABS:  CBC  Recent  Labs Lab 06/27/13 0410 06/28/13 0330 06/29/13 0500  WBC 4.0 3.9* 4.0  HGB 10.6* 9.8* 10.0*  HCT 32.1* 30.2* 30.8*  PLT 166 164 159   Coag's No results found for this basename: APTT, INR,  in the last 168 hours BMET  Recent Labs Lab 06/27/13 0410 06/28/13 0330 06/29/13 0500  NA 132* 131* 128*  K 5.5* 5.0 5.0  CL 102 100 98  CO2 17* 18* 16*  BUN 67* 41* 27*  CREATININE 1.84* 1.47* 1.37*  GLUCOSE 74 84 75   Electrolytes  Recent Labs Lab 06/26/13 1135 06/27/13 0410 06/28/13 0330 06/29/13 0500  CALCIUM 8.4 8.0* 8.2* 8.5  MG 1.9  --  1.5  --   PHOS 5.8*  --  3.0  --    Sepsis Markers  Recent Labs Lab 06/26/13 1133 06/27/13 0400  LATICACIDVEN  --  1.3  PROCALCITON 0.27  --    ABG No results found for this basename: PHART, PCO2ART, PO2ART,  in the last 168 hours Liver Enzymes No results found for this basename: AST, ALT, ALKPHOS, BILITOT, ALBUMIN,  in the last 168 hours Cardiac Enzymes  Recent Labs Lab 06/26/13 0741 06/26/13 1133 06/26/13 1825 06/27/13 0006  TROPONINI  --  <0.30 <0.30 <0.30  PROBNP 936.1*  --   --   --    Glucose No results found for this basename: GLUCAP,  in the last 168 hours  Imaging No results found. 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), Western blot positive  HAART per ID  RUL cavitary lesion in immunocompromised host ( bacterial, fungal, AFB, less likely malignancy )  Ceftriaxone  Quantiferon gold  FOB / BAL today  Would avoid transbronchial bx at this time ( close to pleural surface - risk of pneumothorax ) and on Heparin gtt for  DVT  ( risk of hemorrhage )  LLE DVT PE unlikely ruled out  Heparin gtt, restart after bronchoscopy  Start Coumadin 3/37  Dehydration / hypovolemia Acute renal failure, improving Hyperkalemia, resolved Metabolic acidosis  Trend BMP  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  Lonia Farber, MD Pulmonary and Critical Care Medicine [facility] Pager: [phone]  06/29/2013, 12:02 PM

## 2013-06-30 ENCOUNTER — Encounter (HOSPITAL_COMMUNITY): Payer: Self-pay | Admitting: Pulmonary Disease

## 2013-06-30 DIAGNOSIS — N19 Unspecified kidney failure: Secondary | ICD-10-CM

## 2013-06-30 LAB — CBC
HCT: 30.8 % — ABNORMAL LOW (ref 39.0–52.0)
Hemoglobin: 10.1 g/dL — ABNORMAL LOW (ref 13.0–17.0)
MCH: 25.3 pg — ABNORMAL LOW (ref 26.0–34.0)
MCHC: 32.8 g/dL (ref 30.0–36.0)
MCV: 77.2 fL — ABNORMAL LOW (ref 78.0–100.0)
Platelets: 155 10*3/uL (ref 150–400)
RBC: 3.99 MIL/uL — AB (ref 4.22–5.81)
RDW: 16.9 % — ABNORMAL HIGH (ref 11.5–15.5)
WBC: 3.2 10*3/uL — ABNORMAL LOW (ref 4.0–10.5)

## 2013-06-30 LAB — HIV-1 RNA ULTRAQUANT REFLEX TO GENTYP+
HIV 1 RNA QUANT: 293171 {copies}/mL — AB (ref <20)
HIV-1 RNA Quant, Log: 5.47 {Log} — ABNORMAL HIGH (ref <1.30)

## 2013-06-30 LAB — PNEUMOCYSTIS JIROVECI SMEAR BY DFA: Pneumocystis jiroveci Ag: NEGATIVE

## 2013-06-30 LAB — HEPARIN LEVEL (UNFRACTIONATED)
HEPARIN UNFRACTIONATED: 0.26 [IU]/mL — AB (ref 0.30–0.70)
HEPARIN UNFRACTIONATED: 0.42 [IU]/mL (ref 0.30–0.70)

## 2013-06-30 MED ORDER — SULFAMETHOXAZOLE-TMP DS 800-160 MG PO TABS
1.0000 | ORAL_TABLET | Freq: Every day | ORAL | Status: DC
  Administered 2013-06-30 – 2013-07-03 (×4): 1 via ORAL
  Filled 2013-06-30 (×5): qty 1

## 2013-06-30 MED ORDER — VITAMIN B-6 50 MG PO TABS
50.0000 mg | ORAL_TABLET | Freq: Every day | ORAL | Status: DC
  Administered 2013-07-01 – 2013-07-04 (×4): 50 mg via ORAL
  Filled 2013-06-30 (×4): qty 1

## 2013-06-30 MED ORDER — RIFAMPIN 300 MG PO CAPS
600.0000 mg | ORAL_CAPSULE | Freq: Every day | ORAL | Status: DC
  Administered 2013-07-01 – 2013-07-04 (×4): 600 mg via ORAL
  Filled 2013-06-30 (×4): qty 2

## 2013-06-30 MED ORDER — ISONIAZID 300 MG PO TABS
300.0000 mg | ORAL_TABLET | Freq: Every day | ORAL | Status: DC
Start: 2013-07-01 — End: 2013-07-04
  Administered 2013-07-01 – 2013-07-04 (×4): 300 mg via ORAL
  Filled 2013-06-30 (×4): qty 1

## 2013-06-30 MED ORDER — AZITHROMYCIN 600 MG PO TABS
1200.0000 mg | ORAL_TABLET | ORAL | Status: DC
  Administered 2013-06-30: 1200 mg via ORAL
  Filled 2013-06-30: qty 2

## 2013-06-30 MED ORDER — PYRAZINAMIDE 500 MG PO TABS
2000.0000 mg | ORAL_TABLET | Freq: Every day | ORAL | Status: DC
  Administered 2013-07-01 – 2013-07-04 (×4): 2000 mg via ORAL
  Filled 2013-06-30 (×4): qty 4

## 2013-06-30 MED ORDER — ETHAMBUTOL HCL 400 MG PO TABS
1600.0000 mg | ORAL_TABLET | Freq: Every day | ORAL | Status: DC
  Administered 2013-07-01 – 2013-07-04 (×4): 1600 mg via ORAL
  Filled 2013-06-30 (×4): qty 4

## 2013-06-30 MED ORDER — AMOXICILLIN-POT CLAVULANATE 875-125 MG PO TABS
1.0000 | ORAL_TABLET | Freq: Two times a day (BID) | ORAL | Status: DC
  Administered 2013-06-30: 1 via ORAL
  Filled 2013-06-30 (×2): qty 1

## 2013-06-30 NOTE — Progress Notes (Signed)
[facility]  Date of Admission:  06/26/2013  Antibiotics: ceftriaxone  Subjective: No complaints, no sob, no breathing issues  Objective: Temp:  [98.4 F (36.9 C)-99.7 F (37.6 C)] 99 F (37.2 C) (03/27 0821) Pulse Rate:  [67-82] 74 (03/27 0821) Resp:  [12-27] 18 (03/27 0821) BP: (80-99)/(36-61) 97/54 mmHg (03/27 0821) SpO2:  [92 %-100 %] 97 % (03/27 0821) Weight:  [213 lb 11.2 oz (96.934 kg)] 213 lb 11.2 oz (96.934 kg) (03/27 0500)  General: Awake, alert, nad Skin: no rashes Lungs: CTA b, no crackles noted Cor: RRR without m Abdomen: soft, nt, nd, +bs Ext: no edema  Lab Results Lab Results  Component Value Date   WBC 3.2* 06/30/2013   HGB 10.1* 06/30/2013   HCT 30.8* 06/30/2013   MCV 77.2* 06/30/2013   PLT 155 06/30/2013    Lab Results  Component Value Date   CREATININE 1.37* 06/29/2013   BUN 27* 06/29/2013   NA 128* 06/29/2013   K 5.0 06/29/2013   CL 98 06/29/2013   CO2 16* 06/29/2013    No results found for this basename: ALT, AST, GGT, ALKPHOS, BILITOT      Microbiology: Recent Results (from the past 240 hour(s))  URINE CULTURE     Status: None   Collection Time    06/26/13 12:13 PM      Result Value Ref Range Status   Specimen Description URINE, CLEAN CATCH   Final   Special Requests NONE   Final   Culture  Setup Time     Final   Value: 06/26/2013 16:42     Performed at [facility] Count     Final   Value: 1,000 COLONIES/ML     Performed at [facility]   Culture     Final   Value: INSIGNIFICANT GROWTH     Performed at [facility]   Report Status 06/27/2013 FINAL   Final  MRSA PCR SCREENING     Status: None   Collection Time    06/26/13  7:43 PM      Result Value Ref Range Status   MRSA by PCR NEGATIVE  NEGATIVE Final   Comment:            The GeneXpert MRSA Assay (FDA     approved for NASAL specimens     only), is one component of a     comprehensive MRSA colonization   surveillance program. It is not     intended to diagnose MRSA     infection nor to guide or     monitor treatment for     MRSA infections.  CULTURE, RESPIRATORY (NON-EXPECTORATED)     Status: None   Collection Time    06/29/13  2:14 PM      Result Value Ref Range Status   Specimen Description BRONCHIAL ALVEOLAR LAVAGE   Final   Special Requests NONE   Final   Gram Stain     Final   Value: MODERATE WBC PRESENT,BOTH PMN AND MONONUCLEAR     NO SQUAMOUS EPITHELIAL CELLS SEEN     NO ORGANISMS SEEN     Performed at [facility]   Culture PENDING   Incomplete   Report Status PENDING   Incomplete    Studies/Results: No results found.  Assessment/Plan: 1) RUL cavitary lesion - he does not appear acutely ill and symptoms have been progressive over time.  DDx Includes bacterial, fungal, mycobacterial, cancer.   -  he could continue on Augmentin for now  -AFB smear will be back this afternoon, if negative, can d/c isolation -all other results pending  2) HIV AIDS - CD4 50, viral load and genotype pending.  Confirmed with Western Blot.  Will wait for genotype before starting ARVs.  -we will schedule him [facility] for next week -he will need azithromycin 1200 mg po weekly and bactrim DS 1 tab daily, I will order  Ok from ID standpoint for d/c, though would like to wait for AFB smear today to see if Health Dept needs to be involved.  Dr Ninetta LightsHatcher will see this weekend if he is here.  thanks  Staci RighterOMER, Masey Scheiber, MD [facility] [email or web address] [phone] pager   [phone] 06/30/2013, 10:21 AM

## 2013-06-30 NOTE — Care Management Note (Signed)
   CARE MANAGEMENT NOTE 06/30/2013  Patient:  David Fry, David Fry   Account Number:  [id number]  Date Initiated:  06/30/2013  Documentation initiated by:  Lannette Avellino  Subjective/Objective Assessment:   CM to follow for progression and d/c planning.     Action/Plan:   06/30/2013 Met with pt re d/c needs. Pt now lives in [place] x 2 weeks as he came here for a job. He is currently working and living in a motel. Has [employer] but has not made contact with [facility] services in this area.   Anticipated DC Date:  07/01/2013   Anticipated DC Plan:  HOME/SELF CARE         Choice offered to / List presented to:             Status of service:  In process, will continue to follow Medicare Important Message given?   (If response is "NO", the following Medicare IM given date fields will be blank) Date Medicare IM given:   Date Additional Medicare IM given:    Discharge Disposition:  HOME/SELF CARE  Per UR Regulation:    If discussed at Long Length of Stay Meetings, dates discussed:    Comments:  06/30/2013 Met with pt and discussed VA options in this area, pt provided with name of Social worker who can assist him at time of d/c with contacts and followup as needed. Pt has no DME needs or HH needs at this time. Will be followed by ID clinic. Jasmine Pang RN MPH, [phone], case manager.

## 2013-06-30 NOTE — Progress Notes (Signed)
ANTICOAGULATION CONSULT NOTE - Follow Up Consult  Pharmacy Consult for heparin Indication: DVT  No Known Allergies  Patient Measurements: Height: 6\' 5"  (195.6 cm) Weight: 213 lb 11.2 oz (96.934 kg) IBW/kg (Calculated) : 89.1 Heparin Dosing Weight: 98 kg  Vital Signs: Temp: 99 F (37.2 C) (03/27 0821) Temp src: Oral (03/27 0821) BP: 97/54 mmHg (03/27 0821) Pulse Rate: 74 (03/27 0821)  Labs:  Recent Labs  06/28/13 0330  06/29/13 0500 06/29/13 1208 06/30/13 0615 06/30/13 1503  HGB 9.8*  --  10.0*  --  10.1*  --   HCT 30.2*  --  30.8*  --  30.8*  --   PLT 164  --  159  --  155  --   HEPARINUNFRC 0.19*  < >  --  <0.10* 0.26* 0.42  CREATININE 1.47*  --  1.37*  --   --   --   < > = values in this interval not displayed.  Estimated Creatinine Clearance: 76.8 ml/min (by C-G formula based on Cr of 1.37).   Assessment: Patient is a 58 y.o M on heparin for DVT.  Level is therapeutic after rate increased to 2600 units/hr.  No bleeding documented.  Goal of Therapy:  Heparin level 0.3-0.7 units/ml Monitor platelets by anticoagulation protocol: Yes   Plan:  1) continue heparin rate at 2600 units/hr.  Manu Rubey P 06/30/2013,4:01 PM

## 2013-06-30 NOTE — Progress Notes (Addendum)
Results of bronchoscopy noted.  3+ AFB on smear.  Concern for tuberculosis.  [facility].  I will start him empirically on Tb treatment pending continuation of treatment through the health department.  Will send probe for Tb.     I have discussed this with the patient.

## 2013-06-30 NOTE — Consult Note (Signed)
WOC follow-up: Current ostomy pouch to stoma and fistula site is intact with good seal.  Pt states he recently applied a new one.  He is independent with pouch application and emptying when at home.  He is emptying Q hour since it is high output with large amt thick brownish-green drainage.  Supplies at bedside for pt and staff use. He denies need for further assistance at this time. Please re-consult if further assistance is needed.  Thank-you,  Cammie Mcgeeawn Terrez Ander MSN, RN, CWOCN, [place]ineWCN-AP, CNS [phone]

## 2013-07-01 ENCOUNTER — Encounter (HOSPITAL_COMMUNITY): Payer: Self-pay | Admitting: *Deleted

## 2013-07-01 DIAGNOSIS — A150 Tuberculosis of lung: Secondary | ICD-10-CM

## 2013-07-01 LAB — HISTOPLASMA ANTIGEN, URINE

## 2013-07-01 LAB — HEPATITIS A ANTIBODY, TOTAL: Hep A Total Ab: BORDERLINE

## 2013-07-01 LAB — COMPREHENSIVE METABOLIC PANEL
ALBUMIN: 2 g/dL — AB (ref 3.5–5.2)
ALT: 22 U/L (ref 0–53)
AST: 22 U/L (ref 0–37)
Alkaline Phosphatase: 172 U/L — ABNORMAL HIGH (ref 39–117)
BILIRUBIN TOTAL: 0.4 mg/dL (ref 0.3–1.2)
BUN: 21 mg/dL (ref 6–23)
CALCIUM: 8.7 mg/dL (ref 8.4–10.5)
CO2: 20 mEq/L (ref 19–32)
CREATININE: 1.42 mg/dL — AB (ref 0.50–1.35)
Chloride: 96 mEq/L (ref 96–112)
GFR calc Af Amer: 63 mL/min — ABNORMAL LOW (ref >90)
GFR calc non Af Amer: 54 mL/min — ABNORMAL LOW (ref >90)
Glucose, Bld: 88 mg/dL (ref 70–99)
Potassium: 4.8 mEq/L (ref 3.7–5.3)
Sodium: 130 mEq/L — ABNORMAL LOW (ref 137–147)
Total Protein: 7.2 g/dL (ref 6.0–8.3)

## 2013-07-01 LAB — PROTIME-INR
INR: 1.08 (ref 0.00–1.49)
Prothrombin Time: 13.8 seconds (ref 11.6–15.2)

## 2013-07-01 LAB — CBC
HCT: 32 % — ABNORMAL LOW (ref 39.0–52.0)
Hemoglobin: 10.5 g/dL — ABNORMAL LOW (ref 13.0–17.0)
MCH: 25.4 pg — AB (ref 26.0–34.0)
MCHC: 32.8 g/dL (ref 30.0–36.0)
MCV: 77.3 fL — AB (ref 78.0–100.0)
PLATELETS: 147 10*3/uL — AB (ref 150–400)
RBC: 4.14 MIL/uL — ABNORMAL LOW (ref 4.22–5.81)
RDW: 16.7 % — AB (ref 11.5–15.5)
WBC: 3.5 10*3/uL — ABNORMAL LOW (ref 4.0–10.5)

## 2013-07-01 LAB — HEPATITIS B SURFACE ANTIGEN: Hepatitis B Surface Ag: NEGATIVE

## 2013-07-01 LAB — FUNGAL ANTIBODIES PANEL, ID-BLOOD
ASPERGILLUS NIGER ANTIBODIES: NEGATIVE
Aspergillus Flavus Antibodies: NEGATIVE
Aspergillus fumigatus: NEGATIVE
BLASTOMYCES ABS, QN, DID: NEGATIVE
COCCIDIOIDES ANTIBODY ID: NEGATIVE
HISTOPLASMA AB ID: NEGATIVE

## 2013-07-01 LAB — HEPATITIS C ANTIBODY: HCV AB: NEGATIVE

## 2013-07-01 LAB — HEPATITIS B CORE ANTIBODY, TOTAL: HEP B C TOTAL AB: NONREACTIVE

## 2013-07-01 LAB — HEPARIN LEVEL (UNFRACTIONATED)
HEPARIN UNFRACTIONATED: 0.7 [IU]/mL (ref 0.30–0.70)
Heparin Unfractionated: 0.61 IU/mL (ref 0.30–0.70)

## 2013-07-01 LAB — HEPATITIS B SURFACE ANTIBODY,QUALITATIVE: HEP B S AB: NEGATIVE

## 2013-07-01 MED ORDER — WARFARIN SODIUM 5 MG PO TABS
5.0000 mg | ORAL_TABLET | Freq: Once | ORAL | Status: DC
  Filled 2013-07-01: qty 1

## 2013-07-01 MED ORDER — HEPARIN (PORCINE) IN NACL 100-0.45 UNIT/ML-% IJ SOLN
2500.0000 [IU]/h | INTRAMUSCULAR | Status: DC
  Administered 2013-07-01 – 2013-07-03 (×5): 2500 [IU]/h via INTRAVENOUS
  Filled 2013-07-01 (×6): qty 250

## 2013-07-01 MED ORDER — WARFARIN VIDEO
Freq: Once | Status: AC
  Administered 2013-07-01: 21:00:00

## 2013-07-01 MED ORDER — WARFARIN - PHARMACIST DOSING INPATIENT
Freq: Every day | Status: DC
  Administered 2013-07-01 – 2013-07-02 (×2)

## 2013-07-01 MED ORDER — PATIENT'S GUIDE TO USING COUMADIN BOOK
Freq: Once | Status: AC
  Administered 2013-07-01: 1
  Filled 2013-07-01: qty 1

## 2013-07-01 MED ORDER — WARFARIN SODIUM 7.5 MG PO TABS
7.5000 mg | ORAL_TABLET | Freq: Once | ORAL | Status: AC
  Administered 2013-07-01: 7.5 mg via ORAL
  Filled 2013-07-01: qty 1

## 2013-07-01 NOTE — Progress Notes (Signed)
PULMONARY / CRITICAL CARE MEDICINE  Name: David Fry MRN: [id number] DOB: [birth year]    ADMISSION DATE:  06/26/2013  REFERRING MD :  EDP  PRIMARY SERVICE: PCCM   CHIEF COMPLAINT:  Dyspnea, acute renal failure   BRIEF PATIENT DESCRIPTION: 58 yo active smoker without significant medical history [place] presented 3/23 with 2 weeks of dyspnea, weakness and fatigue.  In ER found to be in acute renal failure with hyperkalemia, hypotensive, with RUL cavitary infiltrate.   SIGNIFICANT EVENTS / STUDIES:  3/23  CT chest >>> cavitary masslike area in the right upper lobe measuring up to 9.5 cm with surrounding extensive airspace consolidation. 3/23  VQ scan  >>> Low probability of PE 3/23  BLE dopplers >>> DVT left peroneal vein.  3/23  Renal US >>> Unremarkable  LINES / TUBES:  CULTURES: 3/23  Urine >>> neg 3/23  Urine Strep Ag >>> neg 3/23  Urine Legionella Ag  >>> ne 3/26  BAL >> AFB +  ANTIBIOTICS: Rocephin 3/23 >>> Azithrimycin 3/23 >>>  INTERVAL HISTORY: no new events overnight.  AFB + on bronch, and await further ID.  Being followed by ID service.  VITAL SIGNS: Temp:  [97.9 F (36.6 C)-101 F (38.3 C)] 98.6 F (37 C) (03/28 0900) Pulse Rate:  [70-83] 82 (03/28 0900) Resp:  [18-23] 23 (03/28 0900) BP: (82-101)/(48-53) 95/48 mmHg (03/28 0900) SpO2:  [95 %-99 %] 98 % (03/28 0900) Weight:  [97.024 kg (213 lb 14.4 oz)] 97.024 kg (213 lb 14.4 oz) (03/28 0504)  HEMODYNAMICS:   VENTILATOR SETTINGS:   INTAKE / OUTPUT: Intake/Output     03/27 0701 - 03/28 0700 03/28 0701 - 03/29 0700   P.O. 1800 120   I.V. (mL/kg) 432 (4.5)    Total Intake(mL/kg) 2232 (23) 120 (1.2)   Urine (mL/kg/hr) 620 (0.3) 250 (0.5)   Stool 1255 (0.5) 175 (0.4)   Total Output 1875 425   Net +357 -305         PHYSICAL EXAMINATION: General:  Wd male in nad, +temporal wasting HEENT:  No JVD or TMG Cardiovascular:  Regular, no murmurs Lungs: clear bilat with no  wheezing Abd soft, nt, nd, bs+ LE without edema, no cyanosis Alert and oriented, moves all 4 extrem.   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 -continue isolation  LLE DVT -on heparin, and will restart coumadin today.

## 2013-07-01 NOTE — Progress Notes (Addendum)
ANTICOAGULATION CONSULT NOTE - Follow Up Consult  Pharmacy Consult for heparin Indication: DVT  No Known Allergies  Patient Measurements: Height: 6\' 5"  (195.6 cm) Weight: 213 lb 14.4 oz (97.024 kg) IBW/kg (Calculated) : 89.1 Heparin Dosing Weight: 98 kg  Vital Signs: Temp: 99.9 F (37.7 C) (03/28 0504) Temp src: Oral (03/28 0504) BP: 82/48 mmHg (03/28 0504) Pulse Rate: 70 (03/28 0504)  Labs:  Recent Labs  06/29/13 0500  06/30/13 0615 06/30/13 1503 07/01/13 0545  HGB 10.0*  --  10.1*  --  10.5*  HCT 30.8*  --  30.8*  --  32.0*  PLT 159  --  155  --  147*  HEPARINUNFRC  --   < > 0.26* 0.42 0.70  CREATININE 1.37*  --   --   --  1.42*  < > = values in this interval not displayed.  Estimated Creatinine Clearance: 74.1 ml/min (by C-G formula based on Cr of 1.42).   Assessment: Patient is a 58 y.o M on heparin for DVT.  Level is at upper end of therapeutic range on 2600 units/hr.  No bleeding documented.    Goal of Therapy:  Heparin level 0.3-0.7 units/ml Monitor platelets by anticoagulation protocol: Yes   Plan:  1) Will decrease heparin rate to 2500 units/hr. 2) Recheck heparin level in 6 hrs to ensure therapeutic. 3) Continue daily heparin level and CBC. 4) Start Coumadin soon?  (NOACs not feasible option due to concomitant therapy with TB meds - specifically rifampin.)  Tad MooreJessica Waldron Gerry, Pharm D, BCPS  Clinical Pharmacist Pager [phone]  07/01/2013 8:27 AM    Addendum: Pharmacy asked to add Coumadin today as well, ordered baseline INR.  Will start Coumadin 7.5 mg po x 1.  Will need to monitor INR closely to watch for possible interaction with rifampin.  Coumadin education prior to discharge.  Tad MooreJessica Tiyona Desouza, Pharm D, BCPS  Clinical Pharmacist Pager [phone]  07/01/2013 12:58 PM

## 2013-07-01 NOTE — Progress Notes (Signed)
INFECTIOUS DISEASE PROGRESS NOTE  ID: David Fry is a 58 y.o. male with  Active Problems:   Acute renal failure   Cavitary pneumonia   Lung mass   Hyperkalemia   HIV infection   DVT (deep venous thrombosis)  Subjective: No SOB, no CP.   Abtx:  Anti-infectives   Start     Dose/Rate Route Frequency Ordered Stop   07/01/13 1000  isoniazid (NYDRAZID) tablet 300 mg     300 mg Oral Daily 06/30/13 1656     07/01/13 1000  rifampin (RIFADIN) capsule 600 mg     600 mg Oral Daily 06/30/13 1656     07/01/13 1000  pyrazinamide tablet 2,000 mg     2,000 mg Oral Daily 06/30/13 1656     07/01/13 1000  ethambutol (MYAMBUTOL) tablet 1,600 mg     1,600 mg Oral Daily 06/30/13 1656     06/30/13 1200  azithromycin (ZITHROMAX) tablet 1,200 mg     1,200 mg Oral Weekly 06/30/13 1036     06/30/13 1130  amoxicillin-clavulanate (AUGMENTIN) 875-125 MG per tablet 1 tablet  Status:  Discontinued     1 tablet Oral Every 12 hours 06/30/13 1020 06/30/13 1711   06/30/13 1130  sulfamethoxazole-trimethoprim (BACTRIM DS) 800-160 MG per tablet 1 tablet     1 tablet Oral Daily 06/30/13 1036     06/26/13 1145  cefTRIAXone (ROCEPHIN) 2 g in dextrose 5 % 50 mL IVPB  Status:  Discontinued     2 g 100 mL/hr over 30 Minutes Intravenous Every 24 hours 06/26/13 1134 06/30/13 1020   06/26/13 1145  azithromycin (ZITHROMAX) 500 mg in dextrose 5 % 250 mL IVPB     500 mg 250 mL/hr over 60 Minutes Intravenous  Once 06/26/13 1134 06/26/13 1422      Medications:  Scheduled: . azithromycin  1,200 mg Oral Weekly  . ethambutol  1,600 mg Oral Daily  . isoniazid  300 mg Oral Daily  . pyrazinamide  2,000 mg Oral Daily  . vitamin B-6  50 mg Oral Daily  . rifampin  600 mg Oral Daily  . sulfamethoxazole-trimethoprim  1 tablet Oral Daily    Objective: Vital signs in last 24 hours: Temp:  [98.6 F (37 C)-101 F (38.3 C)] 98.6 F (37 C) (03/28 0900) Pulse Rate:  [70-83] 82 (03/28 0900) Resp:  [18-23] 23 (03/28  0900) BP: (82-95)/(48-50) 95/48 mmHg (03/28 0900) SpO2:  [95 %-98 %] 98 % (03/28 0900) Weight:  [97.024 kg (213 lb 14.4 oz)] 97.024 kg (213 lb 14.4 oz) (03/28 0504)   General appearance: alert, cooperative and no distress Resp: clear to auscultation bilaterally Cardio: regular rate and rhythm GI: normal findings: bowel sounds normal and soft, non-tender Extremities: edema none  Lab Results  Recent Labs  06/29/13 0500 06/30/13 0615 07/01/13 0545  WBC 4.0 3.2* 3.5*  HGB 10.0* 10.1* 10.5*  HCT 30.8* 30.8* 32.0*  NA 128*  --  130*  K 5.0  --  4.8  CL 98  --  96  CO2 16*  --  20  BUN 27*  --  21  CREATININE 1.37*  --  1.42*   Liver Panel  Recent Labs  07/01/13 0545  PROT 7.2  ALBUMIN 2.0*  AST 22  ALT 22  ALKPHOS 172*  BILITOT 0.4   Sedimentation Rate No results found for this basename: ESRSEDRATE,  in the last 72 hours C-Reactive Protein No results found for this basename: CRP,  in the last 72  hours  Microbiology: Recent Results (from the past 240 hour(s))  URINE CULTURE     Status: None   Collection Time    06/26/13 12:13 PM      Result Value Ref Range Status   Specimen Description URINE, CLEAN CATCH   Final   Special Requests NONE   Final   Culture  Setup Time     Final   Value: 06/26/2013 16:42     Performed at [facility] Count     Final   Value: 1,000 COLONIES/ML     Performed at [facility]   Culture     Final   Value: INSIGNIFICANT GROWTH     Performed at [facility]   Report Status 06/27/2013 FINAL   Final  MRSA PCR SCREENING     Status: None   Collection Time    06/26/13  7:43 PM      Result Value Ref Range Status   MRSA by PCR NEGATIVE  NEGATIVE Final   Comment:            The GeneXpert MRSA Assay (FDA     approved for NASAL specimens     only), is one component of a     comprehensive MRSA colonization     surveillance program. It is not     intended to diagnose MRSA     infection nor to guide or      monitor treatment for     MRSA infections.  AFB CULTURE WITH SMEAR     Status: None   Collection Time    06/29/13  2:14 PM      Result Value Ref Range Status   Specimen Description BRONCHIAL ALVEOLAR LAVAGE   Final   Special Requests NONE   Final   ACID FAST SMEAR     Final   Value: [id number] 3+ ACID FAST BACILLI SEEN CRITICAL RESULT CALLED TO, READ BACK BY AND VERIFIED WITH: HELENA STREET 3/27 AT 1524 BY DUNN[facility] CRITICAL RESULT CALLED TO, READ BACK BY AND VERIFIED WITH: SUSAN CARDWELL AT 1600 BY DUNNJ REPORT FAXED BY REQUEST [phone]     Performed at [facility]   Culture     Final   Value: CULTURE WILL BE EXAMINED FOR 6 WEEKS BEFORE ISSUING A FINAL REPORT     Performed at [facility]   Report Status PENDING   Incomplete  CULTURE, RESPIRATORY (NON-EXPECTORATED)     Status: None   Collection Time    06/29/13  2:14 PM      Result Value Ref Range Status   Specimen Description BRONCHIAL ALVEOLAR LAVAGE   Final   Special Requests NONE   Final   Gram Stain     Final   Value: MODERATE WBC PRESENT,BOTH PMN AND MONONUCLEAR     NO SQUAMOUS EPITHELIAL CELLS SEEN     NO ORGANISMS SEEN     Performed at [facility]   Culture     Final   Value: NO GROWTH     Performed at [facility]   Report Status PENDING   Incomplete  FUNGUS CULTURE W SMEAR     Status: None   Collection Time    06/29/13  2:16 PM      Result Value Ref Range Status   Specimen Description BRONCHIAL ALVEOLAR LAVAGE   Final   Special Requests NONE   Final   Fungal Smear     Final   Value:  NO YEAST OR FUNGAL ELEMENTS SEEN     [facility]   Culture     Final   Value: CULTURE IN PROGRESS FOR FOUR WEEKS     [facility]formed at [facility]   Report Status PENDING   Incomplete  PNEUMOCYSTIS JIROVECI SMEAR BY DFA     Status: None   Collection Time    06/29/13  2:18 PM      Result Value Ref Range Status   Specimen Source-[facility] BRONCHIAL ALVEOLAR LAVAGE   Final   Pneumocystis  jiroveci Ag NEGATIVE   Final   Comment: [facility]    Studies/Results: No results found.   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 Has not gotten his TB pills yet today.  Would not hold him here if he clinically looks well but still has fever, provided he is tolerating TB rx.  Available if questions....         David Fry Infectious Diseases (pager) [phone] [email or web address] 07/01/2013, 12:40 PM  LOS: 5 days

## 2013-07-01 NOTE — Progress Notes (Signed)
ANTICOAGULATION CONSULT NOTE - Follow Up Consult  Pharmacy Consult for Heparin  Indication: DVT  No Known Allergies  Patient Measurements: Height: 6\' 5"  (195.6 cm) Weight: 213 lb 14.4 oz (97.024 kg) IBW/kg (Calculated) : 89.1 Heparin Dosing Weight: 97 kg  Vital Signs: Temp: 98.6 F (37 C) (03/28 0900) Temp src: Oral (03/28 0900) BP: 95/48 mmHg (03/28 0900) Pulse Rate: 82 (03/28 0900)  Labs:  Recent Labs  06/29/13 0500  06/30/13 0615 06/30/13 1503 07/01/13 0545 07/01/13 1433  HGB 10.0*  --  10.1*  --  10.5*  --   HCT 30.8*  --  30.8*  --  32.0*  --   PLT 159  --  155  --  147*  --   LABPROT  --   --   --   --   --  13.8  INR  --   --   --   --   --  1.08  HEPARINUNFRC  --   < > 0.26* 0.42 0.70 0.61  CREATININE 1.37*  --   --   --  1.42*  --   < > = values in this interval not displayed.  Estimated Creatinine Clearance: 74.1 ml/min (by C-G formula based on Cr of 1.42).   Medications:  Heparin @ 2500 units/hr  Assessment: 58 y.o. M who continues on heparin for DVT with a therapeutic heparin level this evening (HL 0.61 << 0.7, goal of 0.3-0.7). No repeat CBC since this a.m. No overt s/sx of bleeding noted.   Goal of Therapy:  Heparin level 0.3-0.7 units/ml Monitor platelets by anticoagulation protocol: Yes   Plan:  1. Continue heparin at 2500 units/hr (25 ml/hr) 2. Will continue to monitor for any signs/symptoms of bleeding and will follow up with heparin level in the a.m.   Georgina PillionElizabeth Ladonya Jerkins, PharmD, BCPS Clinical Pharmacist Pager: [phone] 07/01/2013 5:38 PM

## 2013-07-02 LAB — CULTURE, RESPIRATORY

## 2013-07-02 LAB — CULTURE, RESPIRATORY W GRAM STAIN

## 2013-07-02 LAB — PROTIME-INR
INR: 1.14 (ref 0.00–1.49)
Prothrombin Time: 14.4 seconds (ref 11.6–15.2)

## 2013-07-02 LAB — CBC
HEMATOCRIT: 33.3 % — AB (ref 39.0–52.0)
Hemoglobin: 11 g/dL — ABNORMAL LOW (ref 13.0–17.0)
MCH: 25.5 pg — ABNORMAL LOW (ref 26.0–34.0)
MCHC: 33 g/dL (ref 30.0–36.0)
MCV: 77.1 fL — AB (ref 78.0–100.0)
Platelets: 150 10*3/uL (ref 150–400)
RBC: 4.32 MIL/uL (ref 4.22–5.81)
RDW: 16.7 % — AB (ref 11.5–15.5)
WBC: 3.2 10*3/uL — ABNORMAL LOW (ref 4.0–10.5)

## 2013-07-02 LAB — HEPARIN LEVEL (UNFRACTIONATED): Heparin Unfractionated: 0.62 IU/mL (ref 0.30–0.70)

## 2013-07-02 MED ORDER — WARFARIN SODIUM 10 MG PO TABS
10.0000 mg | ORAL_TABLET | Freq: Once | ORAL | Status: AC
  Administered 2013-07-02: 10 mg via ORAL
  Filled 2013-07-02: qty 1

## 2013-07-02 NOTE — Progress Notes (Signed)
PULMONARY / CRITICAL CARE MEDICINE  Name: David Fry MRN: [id number] DOB: Dec 01, 1955    ADMISSION DATE:  06/26/2013  REFERRING MD :  EDP  PRIMARY SERVICE: PCCM   CHIEF COMPLAINT:  Dyspnea, acute renal failure   BRIEF PATIENT DESCRIPTION: 58 yo active smoker without significant medical history [place] presented 3/23 with 2 weeks of dyspnea, weakness and fatigue.  In ER found to be in acute renal failure with hyperkalemia, hypotensive, with RUL cavitary infiltrate.   SIGNIFICANT EVENTS / STUDIES:  3/23  CT chest >>> cavitary masslike area in the right upper lobe measuring up to 9.5 cm with surrounding extensive airspace consolidation. 3/23  VQ scan  >>> Low probability of PE 3/23  BLE dopplers >>> DVT left peroneal vein.  3/23  Renal US >>> Unremarkable  LINES / TUBES:  CULTURES: 3/23  Urine >>> neg 3/23  Urine Strep Ag >>> neg 3/23  Urine Legionella Ag  >>> ne 3/26  BAL >> AFB +  ANTIBIOTICS: Rocephin 3/23 >>> Azithrimycin 3/23 >>>  INTERVAL HISTORY: no new events overnight.  AFB + on bronch, and await further ID.  Being followed by ID service.  He denies sob, and no significant mucus production.   VITAL SIGNS: Temp:  [98.2 F (36.8 C)-99.1 F (37.3 C)] 98.2 F (36.8 C) (03/29 0500) Pulse Rate:  [32-76] 60 (03/29 0749) Resp:  [18-22] 18 (03/29 0500) BP: (78-104)/(42-64) 84/42 mmHg (03/29 0749) SpO2:  [95 %-100 %] 100 % (03/29 0500) Weight:  [95.255 kg (210 lb)] 95.255 kg (210 lb) (03/29 0500)  HEMODYNAMICS:   VENTILATOR SETTINGS:   INTAKE / OUTPUT: Intake/Output     03/28 0701 - 03/29 0700 03/29 0701 - 03/30 0700   P.O. 840    I.V. (mL/kg) 464.7 (4.9)    Total Intake(mL/kg) 1304.7 (13.7)    Urine (mL/kg/hr) 1200 (0.5)    Stool 1625 (0.7)    Total Output 2825     Net -1520.3           PHYSICAL EXAMINATION: General:  Wd male in nad, +temporal wasting HEENT:  No JVD or TMG Cardiovascular:  Regular, no murmurs Lungs: clear bilat  with no wheezing or rhonchi Abd soft, nt, nd, bs+ LE with minimal edema, no cyanosis Alert and oriented, moves all 4 extrem.   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 -continue isolation  LLE DVT -on heparin, and coumadin restarted 3/28.  This may be rate limiting step for d/c home.

## 2013-07-02 NOTE — Discharge Instructions (Signed)
Information on my medicine - Coumadin   (Warfarin)  This medication education was reviewed with me or my healthcare representative as part of my discharge preparation.  The pharmacist that spoke with me during my hospital stay was:  Tad MooreJessica Lorrie Gargan, Pharm D, BCPS 07/02/2013 11:53 AM   Why was Coumadin prescribed for you? Coumadin was prescribed for you because you have a blood clot or a medical condition that can cause an increased risk of forming blood clots. Blood clots can cause serious health problems by blocking the flow of blood to the heart, lung, or brain. Coumadin can prevent harmful blood clots from forming. As a reminder your indication for Coumadin is:   Deep Vein Thrombosis Treatment  What test will check on my response to Coumadin? While on Coumadin (warfarin) you will need to have an INR test regularly to ensure that your dose is keeping you in the desired range. The INR (international normalized ratio) number is calculated from the result of the laboratory test called prothrombin time (PT).  If an INR APPOINTMENT HAS NOT ALREADY BEEN MADE FOR YOU please schedule an appointment to have this lab work done by your health care provider within 7 days. Your INR goal is usually a number between:  2 to 3 or your provider may give you a more narrow range like 2-2.5.  Ask your health care provider during an office visit what your goal INR is.  What  do you need to  know  About  COUMADIN? Take Coumadin (warfarin) exactly as prescribed by your healthcare provider about the same time each day.  DO NOT stop taking without talking to the doctor who prescribed the medication.  Stopping without other blood clot prevention medication to take the place of Coumadin may increase your risk of developing a new clot or stroke.  Get refills before you run out.  What do you do if you miss a dose? If you miss a dose, take it as soon as you remember on the same day then continue your regularly scheduled regimen  the next day.  Do not take two doses of Coumadin at the same time.  Important Safety Information A possible side effect of Coumadin (Warfarin) is an increased risk of bleeding. You should call your healthcare provider right away if you experience any of the following:   Bleeding from an injury or your nose that does not stop.   Unusual colored urine (red or dark brown) or unusual colored stools (red or black).   Unusual bruising for unknown reasons.   A serious fall or if you hit your head (even if there is no bleeding).  Some foods or medicines interact with Coumadin (warfarin) and might alter your response to warfarin. To help avoid this:   Eat a balanced diet, maintaining a consistent amount of Vitamin K.   Notify your provider about major diet changes you plan to make.   Avoid alcohol or limit your intake to 1 drink for women and 2 drinks for men per day. (1 drink is 5 oz. wine, 12 oz. beer, or 1.5 oz. liquor.)  Make sure that ANY health care provider who prescribes medication for you knows that you are taking Coumadin (warfarin).  Also make sure the healthcare provider who is monitoring your Coumadin knows when you have started a new medication including herbals and non-prescription products.  Coumadin (Warfarin)  Major Drug Interactions  Increased Warfarin Effect Decreased Warfarin Effect  Alcohol (large quantities) Antibiotics (esp. Septra/Bactrim, Flagyl, Cipro)  Amiodarone (Cordarone) Aspirin (ASA) Cimetidine (Tagamet) Megestrol (Megace) NSAIDs (ibuprofen, naproxen, etc.) Piroxicam (Feldene) Propafenone (Rythmol SR) Propranolol (Inderal) Isoniazid (INH) Posaconazole (Noxafil) Barbiturates (Phenobarbital) Carbamazepine (Tegretol) Chlordiazepoxide (Librium) Cholestyramine (Questran) Griseofulvin Oral Contraceptives Rifampin Sucralfate (Carafate) Vitamin K   Coumadin (Warfarin) Major Herbal Interactions  Increased Warfarin Effect Decreased Warfarin Effect   Garlic Ginseng Ginkgo biloba Coenzyme Q10 Green tea St. Johns wort    Coumadin (Warfarin) FOOD Interactions  Eat a consistent number of servings per week of foods HIGH in Vitamin K (1 serving =  cup)  Collards (cooked, or boiled & drained) Kale (cooked, or boiled & drained) Mustard greens (cooked, or boiled & drained) Parsley *serving size only =  cup Spinach (cooked, or boiled & drained) Swiss chard (cooked, or boiled & drained) Turnip greens (cooked, or boiled & drained)  Eat a consistent number of servings per week of foods MEDIUM-HIGH in Vitamin K (1 serving = 1 cup)  Asparagus (cooked, or boiled & drained) Broccoli (cooked, boiled & drained, or raw & chopped) Brussel sprouts (cooked, or boiled & drained) *serving size only =  cup Lettuce, raw (green leaf, endive, romaine) Spinach, raw Turnip greens, raw & chopped   These websites have more information on Coumadin (warfarin):  http://www.king-russell.com/; https://www.hines.net/;

## 2013-07-02 NOTE — Progress Notes (Signed)
ANTICOAGULATION CONSULT NOTE - Follow Up Consult  Pharmacy Consult for Heparin  Indication: DVT  No Known Allergies  Patient Measurements: Height: 6\' 5"  (195.6 cm) Weight: 210 lb (95.255 kg) IBW/kg (Calculated) : 89.1 Heparin Dosing Weight: 97 kg  Vital Signs: Temp: 98.2 F (36.8 C) (03/29 0500) Temp src: Oral (03/29 0500) BP: 84/42 mmHg (03/29 0749) Pulse Rate: 60 (03/29 0749)  Labs:  Recent Labs  06/30/13 0615  07/01/13 0545 07/01/13 1433 07/02/13 0615  HGB 10.1*  --  10.5*  --  11.0*  HCT 30.8*  --  32.0*  --  33.3*  PLT 155  --  147*  --  150  LABPROT  --   --   --  13.8 14.4  INR  --   --   --  1.08 1.14  HEPARINUNFRC 0.26*  < > 0.70 0.61 0.62  CREATININE  --   --  1.42*  --   --   < > = values in this interval not displayed.  Estimated Creatinine Clearance: 74.1 ml/min (by C-G formula based on Cr of 1.42).   Medications:  Heparin @ 2500 units/hr  Assessment: 58 y.o. M who continues on heparin for DVT with a therapeutic heparin level this morning.  Also started on Coumadin last night, INR still essentially normal (subtherapeutic) as expected.  May need bigger doses of Coumadin given potential drug interaction with rifampin.  CBC is stable. No overt s/sx of bleeding noted.   Goal of Therapy:  Heparin level 0.3-0.7 units/ml Monitor platelets by anticoagulation protocol: Yes   Plan:  1. Continue heparin at 2500 units/hr (25 ml/hr) 2. Coumadin 10 mg po x 1 tonight. 3. Continue daily heparin level and INR. 4. Consider conversion to Lovenox if patient otherwise stable for discharge? 5. Will complete Coumadin education today.  Tad MooreJessica Harinder Romas, Pharm D, BCPS  Clinical Pharmacist Pager [phone]  07/02/2013 9:46 AM

## 2013-07-02 NOTE — Progress Notes (Signed)
Pt could not get coumadin education on tv.  Pharmacist called patient and explained coumadin education over the phone.  Pt stated she answered all questions about coumadin to his satisfaction.  Pt A&Ox4.  Vss. Denies pain. Pleasant.  Possible discharge home tomorrow.

## 2013-07-03 LAB — HEPARIN LEVEL (UNFRACTIONATED)
Heparin Unfractionated: 1.1 IU/mL — ABNORMAL HIGH (ref 0.30–0.70)
Heparin Unfractionated: 0.33 IU/mL (ref 0.30–0.70)
Heparin Unfractionated: 0.62 IU/mL (ref 0.30–0.70)

## 2013-07-03 LAB — BASIC METABOLIC PANEL
BUN: 35 mg/dL — ABNORMAL HIGH (ref 6–23)
CO2: 18 mEq/L — ABNORMAL LOW (ref 19–32)
Calcium: 8.9 mg/dL (ref 8.4–10.5)
Chloride: 97 mEq/L (ref 96–112)
Creatinine, Ser: 2.06 mg/dL — ABNORMAL HIGH (ref 0.50–1.35)
GFR calc non Af Amer: 35 mL/min — ABNORMAL LOW (ref >90)
GFR, EST AFRICAN AMERICAN: 40 mL/min — AB (ref >90)
Glucose, Bld: 92 mg/dL (ref 70–99)
Potassium: 4.9 mEq/L (ref 3.7–5.3)
Sodium: 131 mEq/L — ABNORMAL LOW (ref 137–147)

## 2013-07-03 LAB — CBC
HEMATOCRIT: 34.8 % — AB (ref 39.0–52.0)
HEMOGLOBIN: 11.3 g/dL — AB (ref 13.0–17.0)
MCH: 25 pg — ABNORMAL LOW (ref 26.0–34.0)
MCHC: 32.5 g/dL (ref 30.0–36.0)
MCV: 77 fL — AB (ref 78.0–100.0)
Platelets: 164 10*3/uL (ref 150–400)
RBC: 4.52 MIL/uL (ref 4.22–5.81)
RDW: 16.9 % — ABNORMAL HIGH (ref 11.5–15.5)
WBC: 3.5 10*3/uL — AB (ref 4.0–10.5)

## 2013-07-03 LAB — PROTIME-INR
INR: 1.67 — AB (ref 0.00–1.49)
PROTHROMBIN TIME: 19.2 s — AB (ref 11.6–15.2)

## 2013-07-03 MED ORDER — SODIUM CHLORIDE 0.9 % IV SOLN
250.0000 mL | INTRAVENOUS | Status: DC | PRN
  Administered 2013-07-03: 250 mL via INTRAVENOUS

## 2013-07-03 MED ORDER — WARFARIN SODIUM 7.5 MG PO TABS
7.5000 mg | ORAL_TABLET | Freq: Once | ORAL | Status: AC
  Administered 2013-07-03: 7.5 mg via ORAL
  Filled 2013-07-03: qty 1

## 2013-07-03 MED ORDER — HEPARIN (PORCINE) IN NACL 100-0.45 UNIT/ML-% IJ SOLN
2100.0000 [IU]/h | INTRAMUSCULAR | Status: DC
  Administered 2013-07-03: 2200 [IU]/h via INTRAVENOUS
  Administered 2013-07-04: 2100 [IU]/h via INTRAVENOUS
  Filled 2013-07-03 (×4): qty 250

## 2013-07-03 NOTE — Progress Notes (Signed)
ANTICOAGULATION CONSULT NOTE - Follow Up Consult  Pharmacy Consult for Heparin + Warfarin  Indication: DVT  No Known Allergies  Patient Measurements: Height: 6\' 5"  (195.6 cm) Weight: 209 lb 3.5 oz (94.9 kg) IBW/kg (Calculated) : 89.1 Heparin Dosing Weight: 97 kg  Vital Signs: Temp: 98.3 F (36.8 C) (03/30 0500) Temp src: Oral (03/30 0500) BP: 90/50 mmHg (03/30 0500) Pulse Rate: 62 (03/30 0500)  Labs:  Recent Labs  07/01/13 0545 07/01/13 1433 07/02/13 0615 07/03/13 0620  HGB 10.5*  --  11.0* 11.3*  HCT 32.0*  --  33.3* 34.8*  PLT 147*  --  150 164  LABPROT  --  13.8 14.4 19.2*  INR  --  1.08 1.14 1.67*  HEPARINUNFRC 0.70 0.61 0.62 1.10*  CREATININE 1.42*  --   --  2.06*    Estimated Creatinine Clearance: 51.1 ml/min (by C-G formula based on Cr of 2.06).   Medications:  Heparin @ 2200 units/hr  Assessment: 58 y.o. M who continues on heparin for DVT, heparin level was supratherapeutic this AM - will check another level this afternoon.  Pt also started on warfarin, INR today 1.67. Pt may need larger doses of Coumadin given potential drug interaction with rifampin.  CBC is stable. No overt s/sx of bleeding noted.   Goal of Therapy:  Heparin level 0.3-0.7 units/ml Monitor platelets by anticoagulation protocol: Yes   Plan:  1. Continue heparin at 2200 units/hr (22 ml/hr) 2. Coumadin 7.5 mg po x 1 tonight 3. Continue daily heparin level and INR 4. Heparin level 1500 today 5. Consider conversion to Lovenox if patient otherwise stable for discharge  Agapito GamesAlison Annisten Manchester, PharmD, BCPS Clinical Pharmacist Pager: [phone] 07/03/2013 8:35 AM

## 2013-07-03 NOTE — Progress Notes (Signed)
ANTICOAGULATION CONSULT NOTE - Follow Up Consult  Pharmacy Consult for heparin Indication: DVT  Labs:  Recent Labs  07/01/13 0545 07/01/13 1433 07/02/13 0615 07/03/13 0620  HGB 10.5*  --  11.0* 11.3*  HCT 32.0*  --  33.3* 34.8*  PLT 147*  --  150 164  LABPROT  --  13.8 14.4 19.2*  INR  --  1.08 1.14 1.67*  HEPARINUNFRC 0.70 0.61 0.62 1.10*  CREATININE 1.42*  --   --  2.06*    Assessment: 58yo male now supratherapeutic on heparin after a few levels at goal with current rate, pt reports labs being drawn from opposite arm from heparin infusion, likely now accumulating with high rate.  Goal of Therapy:  Heparin level 0.3-0.7 units/ml   Plan:  Will hold heparin gtt x45-5460min then resume at lower rate of 2200 units/hr and check level in 6hr.  Vernard GamblesVeronda Avir Deruiter, PharmD, BCPS  07/03/2013,7:38 AM

## 2013-07-03 NOTE — Progress Notes (Signed)
[facility]ious Disease  Date of Admission:  06/26/2013  Antibiotics: RIPE 2 days Bactrim prophylaxis Azithromycin prophylaxis  Subjective: No complaints  Objective: Temp:  [97.7 F (36.5 C)-98.3 F (36.8 C)] 98.3 F (36.8 C) (03/30 0500) Pulse Rate:  [62-96] 62 (03/30 0500) Resp:  [18-20] 18 (03/30 0500) BP: (88-100)/(50-62) 90/50 mmHg (03/30 0500) SpO2:  [95 %-100 %] 95 % (03/30 0500) Weight:  [209 lb 3.5 oz (94.9 kg)] 209 lb 3.5 oz (94.9 kg) (03/30 0500)  General: awake, alert, nad Skin: no rashes Lungs: CTA B Cor: RRR without m Abdomen: soft, + colostomy Ext: no edema  Lab Results Lab Results  Component Value Date   WBC 3.5* 07/03/2013   HGB 11.3* 07/03/2013   HCT 34.8* 07/03/2013   MCV 77.0* 07/03/2013   PLT 164 07/03/2013    Lab Results  Component Value Date   CREATININE 2.06* 07/03/2013   BUN 35* 07/03/2013   NA 131* 07/03/2013   K 4.9 07/03/2013   CL 97 07/03/2013   CO2 18* 07/03/2013    Lab Results  Component Value Date   ALT 22 07/01/2013   AST 22 07/01/2013   ALKPHOS 172* 07/01/2013   BILITOT 0.4 07/01/2013      Microbiology: Recent Results (from the past 240 hour(s))  URINE CULTURE     Status: None   Collection Time    06/26/13 12:13 PM      Result Value Ref Range Status   Specimen Description URINE, CLEAN CATCH   Final   Special Requests NONE   Final   Culture  Setup Time     Final   Value: 06/26/2013 16:42     Performed at [facility]     Final   Value: 1,000 COLONIES/ML     Performed at [facility]   Culture     Final   Value: INSIGNIFICANT GROWTH     Performed at [facility]   Report Status 06/27/2013 FINAL   Final  MRSA PCR SCREENING     Status: None   Collection Time    06/26/13  7:43 PM      Result Value Ref Range Status   MRSA by PCR NEGATIVE  NEGATIVE Final   Comment:            The GeneXpert MRSA Assay (FDA     approved for NASAL specimens     only), is one component of  a     comprehensive MRSA colonization     surveillance program. It is not     intended to diagnose MRSA     infection nor to guide or     monitor treatment for     MRSA infections.  AFB CULTURE WITH SMEAR     Status: None   Collection Time    06/29/13  2:14 PM      Result Value Ref Range Status   Specimen Description BRONCHIAL ALVEOLAR LAVAGE   Final   Special Requests NONE   Final   ACID FAST SMEAR     Final   Value: 4917 3+ ACID FAST BACILLI SEEN CRITICAL RESULT CALLED TO, READ BACK BY AND VERIFIED WITH: HELENA STRE[address] 3/27 AT 1524 BY DUNNJ CRITICAL RESULT CALLED TO, READ BACK BY AND VERIFIED WITH: SUSAN CARDWELL AT [facility] REPORT FAXED BY REQUEST 641     Performed at [facility]     Final  Value: CULTURE WILL BE EXAMINED FOR 6 WEEKS BEFORE ISSUING A FINAL REPORT     Performed at [facility]   Report Status PENDING   Incomplete  CULTURE, RESPIRATORY (NON-EXPECTORATED)     Status: None   Collection Time    06/29/13  2:14 PM      Result Value Ref Range Status   Specimen Description BRONCHIAL ALVEOLAR LAVAGE   Final   Special Requests NONE   Final   Gram Stain     Final   Value: MODERATE WBC PRESENT,BOTH PMN AND MONONUCLEAR     NO SQUAMOUS EPITHELIAL CELLS SEEN     NO ORGANISMS SEEN     Performed at [facility]   Culture     Final   Value: Non-Pathogenic Oropharyngeal-type Flora Isolated.     Performed at [facility]   Report Status 07/02/2013 FINAL   Final  FUNGUS CULTURE W SMEAR     Status: None   Collection Time    06/29/13  2:16 PM      Result Value Ref Range Status   Specimen Description BRONCHIAL ALVEOLAR LAVAGE   Final   Special Requests NONE   Final   Fungal Smear     Final   Value: NO YEAST OR FUNGAL ELEMENTS SEEN     Performed at [facility]   Culture     Final   Value: CANDIDA ALBICANS     Performed at [facility]   Report Status PENDING   Incomplete  PNEUMOCYSTIS JIROVECI SMEAR BY DFA      Status: None   Collection Time    06/29/13  2:18 PM      Result Value Ref Range Status   Specimen Source-PJSRC BRONCHIAL ALVEOLAR LAVAGE   Final   Pneumocystis jiroveci Ag NEGATIVE   Final   Comment: Performed at [facility]    Studies/Results: No results found.  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  Probe for Tb pending.  Started on RIPE and today will be day 3.  Also on B6. The HD will provide meds.   -ok from ID standpoint for D/C after today's dose, health dept aware and will contact -The [place] [facility] [place] ([address]) -patient has a cell phone [phone] -he works nights  2) HIV/AIDS - will defer treatment with concern for Tuberculosis for 2 weeks on RIPE.  He is on rifampin so will consider Tivicay bid + Truvada or Atripla.   -we will arrange follow up in [facility] for the week of April 13th -he will continue on azithromycin 1200 mg po weekly and bactrim DS 1 tab daily  3) DVT - coumadin.  With rifampin, will need to be monitored.    4) creatinine up again today.  I will send off HLA testing as well.  He may need a renally dosed regimen.    Possible d/c tomorrow  Scharlene Gloss, [facility]ious [facility] [place] [email or web address] [phone] pager   [phone] cell 07/03/2013, 9:27 AM

## 2013-07-03 NOTE — Progress Notes (Signed)
PULMONARY / CRITICAL CARE MEDICINE  Name: David BarbaraJames Borkowski MRN: [id number] DOB: 04/30/1955    ADMISSION DATE:  06/26/2013  REFERRING MD :  EDP  PRIMARY SERVICE: PCCM   CHIEF COMPLAINT:  Dyspnea, acute renal failure   BRIEF PATIENT DESCRIPTION: 58 yo active smoker without significant medical history [place], presented 3/23 with 2 weeks of dyspnea, weakness and fatigue.  In ER found to be in acute renal failure with hyperkalemia, hypotensive, with RUL cavitary infiltrate.   SIGNIFICANT EVENTS / STUDIES:  3/23  CT chest >>> cavitary masslike area in the right upper lobe measuring up to 9.5 cm with surrounding extensive airspace consolidation. 3/23  VQ scan  >>> Low probability of PE 3/23  BLE dopplers >>> DVT left peroneal vein.  3/23  Renal US >>> Unremarkable  LINES / TUBES:  CULTURES: 3/23  Urine >>> neg 3/23  Urine Strep Ag >>> neg 3/23  Urine Legionella Ag  >>> ne 3/26  BAL >> AFB +  ANTIBIOTICS: Rocephin 3/23 >>>off Azithrimycin 3/23 >>> RPEI started 3/27>>>  INTERVAL HISTORY:   VITAL SIGNS: Temp:  [97.7 F (36.5 C)-98.3 F (36.8 C)] 98.3 F (36.8 C) (03/30 0500) Pulse Rate:  [62-96] 62 (03/30 0500) Resp:  [18-20] 18 (03/30 0500) BP: (88-100)/(50-62) 90/50 mmHg (03/30 0500) SpO2:  [95 %-100 %] 95 % (03/30 0500) Weight:  [94.9 kg (209 lb 3.5 oz)] 94.9 kg (209 lb 3.5 oz) (03/30 0500) Room air  HEMODYNAMICS:   VENTILATOR SETTINGS:   INTAKE / OUTPUT: Intake/Output     03/29 0701 - 03/30 0700 03/30 0701 - 03/31 0700   P.O. 720    I.V. (mL/kg) 150 (1.6) 10 (0.1)   Total Intake(mL/kg) 870 (9.2) 10 (0.1)   Urine (mL/kg/hr) 700 (0.3)    Stool 1925 (0.8)    Total Output 2625     Net -1755 +10         PHYSICAL EXAMINATION: General:  Wd male in nad, +temporal wasting HEENT:  No JVD or TMG Cardiovascular:  Regular, no murmurs Lungs: clear bilat occ rhonchi w/ cough  Abd soft, nt, nd, bs+ LE with minimal edema, no cyanosis Alert and  oriented, moves all 4 extrem.   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 The health department will go to his place daily for this   LLE DVT PLAN -on heparin, and coumadin restarted 3/28, on day 2/5 overlap. INR not yet therapeutic -will have CM check w/ his insurance re: LMWH however w/ worsening renal fxn this may not be a great option.   AKI +metabolic acidosis  Renal fxn had improved. Creatinine now rising.  PLAN Gentle hydration Ask pharmacy to assist w/ renal dosing of meds as EMB/PZA may require some dose adjustment  F/u chemistry in am  Attending:  I have seen and examined the patient with nurse practitioner/resident  and agree with the note above.    Not sure what is causing increased Cr We are making arrangements for pulmonary and PCP follow up HIV/TB treatment per ID F/u AFB pcr probe result  Yolonda Kida PCCM Pager: [phone] Cell: [phone] If no response, call [phone]

## 2013-07-03 NOTE — Consult Note (Signed)
WOC follow-up: Pt is applying his own pouches and emptying frequently without assistance.  Supplies at bedside for pt use.  He denies need for further assistance at this time. Please re-consult if further assistance is needed.  Thank-you,  Cammie Mcgeeawn Deshanti Adcox MSN, RN, CWOCN, GemWCN-AP, CNS [phone]

## 2013-07-03 NOTE — Care Management (Signed)
Request sent to pt insurance provider re coverage of Lovenox. Per response this pt is covered for generic Lovenox with a $24 copay, prior approval is required with call from PA or MD to [phone]. We based dosage on Pharmacy recommendation for Lovenox 95 mg BID x 5 days.  Johny Shockheryl Yahye Siebert RN MPH, case manager, [phone]

## 2013-07-03 NOTE — Progress Notes (Signed)
ANTIMICROBIAL CONSULT NOTE   Pharmacy consulted to assess dose of antimicrobials in setting of renal insufficiency   Pt on Ethambutol and Pyrazinamide for possible TB No dose adjustments necessary at this point - no further adjustments anticipated unless pt reaches point of ESRD requiring dialysis  Continue ethambutol 1600 mg po daily Continue pyrazinamide 2000 mg po daily Continue azithromycin 1200 mg po weekly Continue Bactrim 800-160 mg po daily  Agapito GamesAlison Lyam Provencio, PharmD, BCPS Clinical Pharmacist Pager: [phone] 07/03/2013 11:17 AM

## 2013-07-03 NOTE — Progress Notes (Signed)
ANTICOAGULATION CONSULT NOTE - Follow Up Consult  Pharmacy Consult for Heparin Indication: DVT  No Known Allergies  Patient Measurements: Height: 6\' 5"  (195.6 cm) Weight: 209 lb 3.5 oz (94.9 kg) IBW/kg (Calculated) : 89.1 Heparin Dosing Weight: 97 kg  Vital Signs:    Labs:  Recent Labs  07/01/13 0545 07/01/13 1433 07/02/13 0615 07/03/13 0620 07/03/13 0900 07/03/13 1700  HGB 10.5*  --  11.0* 11.3*  --   --   HCT 32.0*  --  33.3* 34.8*  --   --   PLT 147*  --  150 164  --   --   LABPROT  --  13.8 14.4 19.2*  --   --   INR  --  1.08 1.14 1.67*  --   --   HEPARINUNFRC 0.70 0.61 0.62 1.10* 0.33 0.62  CREATININE 1.42*  --   --  2.06*  --   --     Estimated Creatinine Clearance: 51.1 ml/min (by C-G formula based on Cr of 2.06).   Medications:  . sodium chloride Stopped (07/03/13 1008)  . heparin 2,200 Units/hr (07/03/13 1728)     Assessment: 58 y.o. M who continues on heparin for DVT.  His heparin level remains therapeutic but with a big increase from this morning's level likely indicating accumulation.   Goal of Therapy:  Heparin level 0.3-0.7 units/ml Monitor platelets by anticoagulation protocol: Yes   Plan:  Decrease Heparin to 2100 units/hr Check AM Heparin level and CBC  Estella HuskMichelle Kaamil Morefield, Pharm.D., BCPS, AAHIVP Clinical Pharmacist Phone: [phone] or [phone] 07/03/2013, 6:10 PM

## 2013-07-04 DIAGNOSIS — I10 Essential (primary) hypertension: Secondary | ICD-10-CM

## 2013-07-04 DIAGNOSIS — R21 Rash and other nonspecific skin eruption: Secondary | ICD-10-CM

## 2013-07-04 DIAGNOSIS — L270 Generalized skin eruption due to drugs and medicaments taken internally: Secondary | ICD-10-CM

## 2013-07-04 LAB — BASIC METABOLIC PANEL
BUN: 37 mg/dL — AB (ref 6–23)
CO2: 18 meq/L — AB (ref 19–32)
Calcium: 8.9 mg/dL (ref 8.4–10.5)
Chloride: 98 mEq/L (ref 96–112)
Creatinine, Ser: 1.93 mg/dL — ABNORMAL HIGH (ref 0.50–1.35)
GFR calc Af Amer: 43 mL/min — ABNORMAL LOW (ref >90)
GFR, EST NON AFRICAN AMERICAN: 37 mL/min — AB (ref >90)
GLUCOSE: 85 mg/dL (ref 70–99)
Potassium: 4.9 mEq/L (ref 3.7–5.3)
Sodium: 130 mEq/L — ABNORMAL LOW (ref 137–147)

## 2013-07-04 LAB — CBC
HCT: 34.1 % — ABNORMAL LOW (ref 39.0–52.0)
HEMOGLOBIN: 11.1 g/dL — AB (ref 13.0–17.0)
MCH: 25.1 pg — AB (ref 26.0–34.0)
MCHC: 32.6 g/dL (ref 30.0–36.0)
MCV: 77 fL — ABNORMAL LOW (ref 78.0–100.0)
Platelets: 177 10*3/uL (ref 150–400)
RBC: 4.43 MIL/uL (ref 4.22–5.81)
RDW: 16.9 % — ABNORMAL HIGH (ref 11.5–15.5)
WBC: 3.9 10*3/uL — ABNORMAL LOW (ref 4.0–10.5)

## 2013-07-04 LAB — QUANTIFERON TB GOLD ASSAY (BLOOD)
INTERFERON GAMMA RELEASE ASSAY: NEGATIVE
Mitogen value: 0.83 IU/mL
Quantiferon Nil Value: 0.23 IU/mL
TB AG VALUE: 0.44 [IU]/mL
TB Antigen Minus Nil Value: 0.21 IU/mL

## 2013-07-04 LAB — RPR: RPR Ser Ql: NONREACTIVE

## 2013-07-04 LAB — HEPATIC FUNCTION PANEL
ALBUMIN: 2.2 g/dL — AB (ref 3.5–5.2)
ALK PHOS: 228 U/L — AB (ref 39–117)
ALT: 23 U/L (ref 0–53)
AST: 25 U/L (ref 0–37)
Bilirubin, Direct: <0.2 mg/dL (ref 0.0–0.3)
TOTAL PROTEIN: 7.4 g/dL (ref 6.0–8.3)
Total Bilirubin: 0.3 mg/dL (ref 0.3–1.2)

## 2013-07-04 LAB — PROTIME-INR
INR: 2.23 — ABNORMAL HIGH (ref 0.00–1.49)
Prothrombin Time: 24 seconds — ABNORMAL HIGH (ref 11.6–15.2)

## 2013-07-04 LAB — HEPARIN LEVEL (UNFRACTIONATED): Heparin Unfractionated: 0.68 IU/mL (ref 0.30–0.70)

## 2013-07-04 MED ORDER — WARFARIN SODIUM 5 MG PO TABS: ORAL_TABLET | ORAL | Status: DC

## 2013-07-04 MED ORDER — AZITHROMYCIN 600 MG PO TABS: 1200.0000 mg | ORAL_TABLET | ORAL | Status: DC

## 2013-07-04 MED ORDER — ENOXAPARIN SODIUM 100 MG/ML ~~LOC~~ SOLN: 90.0000 mg | Freq: Two times a day (BID) | SUBCUTANEOUS | Status: DC

## 2013-07-04 MED ORDER — ETHAMBUTOL HCL 400 MG PO TABS: 1600.0000 mg | ORAL_TABLET | Freq: Every day | ORAL | Status: DC

## 2013-07-04 MED ORDER — PYRIDOXINE HCL 50 MG PO TABS: 50.0000 mg | ORAL_TABLET | Freq: Every day | ORAL | Status: DC

## 2013-07-04 MED ORDER — RIFAMPIN 300 MG PO CAPS: 600.0000 mg | ORAL_CAPSULE | Freq: Every day | ORAL | Status: DC

## 2013-07-04 MED ORDER — DAPSONE 100 MG PO TABS: 100.0000 mg | ORAL_TABLET | Freq: Every day | ORAL | Status: DC

## 2013-07-04 MED ORDER — SULFAMETHOXAZOLE-TRIMETHOPRIM 400-80 MG PO TABS: 1.0000 | ORAL_TABLET | Freq: Every day | ORAL | Status: DC

## 2013-07-04 MED ORDER — SULFAMETHOXAZOLE-TRIMETHOPRIM 400-80 MG PO TABS
1.0000 | ORAL_TABLET | Freq: Every day | ORAL | Status: DC
  Administered 2013-07-04: 1 via ORAL
  Filled 2013-07-04 (×2): qty 1

## 2013-07-04 MED ORDER — DIPHENHYDRAMINE HCL 25 MG PO TABS: 25.0000 mg | ORAL_TABLET | Freq: Four times a day (QID) | ORAL | Status: DC | PRN

## 2013-07-04 MED ORDER — PYRAZINAMIDE 500 MG PO TABS: 2000.0000 mg | ORAL_TABLET | Freq: Every day | ORAL | Status: DC

## 2013-07-04 MED ORDER — ISONIAZID 300 MG PO TABS: 300.0000 mg | ORAL_TABLET | Freq: Every day | ORAL | Status: DC

## 2013-07-04 NOTE — Discharge Summary (Signed)
Physician Discharge Summary       Patient ID: David Fry MRN: [id number] DOB/AGE: January 30, 1956 58 y.o.  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 male from [place] recently relocated here with no known pmh who presented 3/23 with DOE, weakness, fatigue. No previous medical problems he endorses. Noted to have cavitary lung lesion on CT in RUL, acute renal failure. Work up also revealed: DVT left peroneal vein, on 3/23 for which heparin gtt was started. Positive HIV Ab, w/ CD4 50. Prophylactic azithro and bactrim was started: 3/27. BAL completed on 3/26 was 3+ AFB: health department was notified, and started GY:BWLSLHTD, pyrazinamide, isoniazid and ethabutol also on 3/27. Cytology was negative for malignant cells. Coumadin started on 3/29. On day of discharge will be day number 3/5 overlap. His discharge was held by one day due to rising creatinine. This had improved w/ IVFs. On 3/31 we He was deemed stable for discharge on 3/31 with the following plan of care as listed below.   Discharge Plan by diagnoses   HIV / AIDS (CD4=50), Western blot positive  viral load and genotype pending  PLAN  -defer treatment with concern for Tuberculosis for 2 weeks on RIPE. He is on rifampin so will consider Tivicay bid + Truvada or Atripla (per ID)  -ID will arrange follow up in [facility] for the week of April 13th  -he will continue on azithromycin 1200 mg po weekly and dapson 100 mg daily   RUL cavitary lesion in immunocompromised host, +AFB/ probable TB  ID has contacted the health department  PLAN  - Continue Rifampin, pyrazinamide, isoniazid and ethabutol  - continue isolation-->at d/c will need mask if out in public  - ID has contacted health department. 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 The health department will go to his place daily for this   LLE DVT  PLAN  -on heparin, and coumadin restarted 3/28, on day 4/5 overlap. INR therapeutic on d/c. 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  Renal fxn had improved.  PLAN  Encouraged po intake.  Changed bactrim to single strength at d/c Needs f/u chemistry   Drug rash: multiple possible sources. Given high prevalence of sulfa sensitivity will start with stopping bactrim and changing to Dapsone.  Plan Change drug PRN benadryl Health dept to follow   Significant Hospital tests/ studies/ interventions and procedures  Consults: infectious disease  SIGNIFICANT EVENTS / STUDIES:  3/23 CT chest >>> cavitary masslike area in the right upper lobe measuring up to 9.5 cm with surrounding extensive airspace consolidation.  3/23 VQ scan >>> Low probability of PE  3/23 BLE dopplers >>> DVT left peroneal vein.  3/23: heparin gtt started.  3/23 Renal US >>> Unremarkable  3/24: HIV: reactive 3/25: CD4 50/ T helper: 10 3/25: HIV RNA quant: [id number]/ HIV RNA quant, Log: 5.47 3/26: heparin gtt held for BAL, then resumed after  3/26: Flex bronch w/ BAL 3/26: cytology from RUL lavage: No malignant cells  3/27: [facility] notified of + AFB, started on Rifampin, pyrazinamide, isoniazid and ethabutol 3/27: prophylactic bactrim and azithro started 3/29:  coumadin started.   LINES / TUBES:  CULTURES:  3/23 Urine Strep Ag >>> neg  3/23 Urine Legionella Ag >>> ne  3/23 Urine >>> neg  3/24: urine histoplasma: negative 3/25 fungal antibodies panel: negative  3/25: cryptococcal antigen: negative  3/25: quantiferon Gold Tb test>>> 3/26 BAL >> AFB + 3 3/26: M. Tuberculosis complex by PCR>>> 3/26 fungal from BAL: candida>>> 3/26: Pnemocystis smear by DFA: call 3/28: hepatitis panel: negative  3/30: RPR: non-reactive  3/31 HLA B  5701>>>  ANTIBIOTICS:  Rocephin 3/23 >>>off  Azithrimycin 3/23 >>>  RPEI started 3/27>>>  Discharge Exam: BP 95/57  Pulse 76  Temp(Src) 98.6 F (37 C) (Oral)  Resp 21  Ht _0  (1.956 m)  Wt 94.9 kg (209 lb 3.5 oz)  BMI 24.80 kg/m2  SpO2 99% Room air   PHYSICAL EXAMINATION:  General: Wd male in nad, +temporal wasting  HEENT: No JVD or TMG  Cardiovascular: Regular, no murmurs  Lungs: clear bilat occ rhonchi w/ cough  Abd soft, nt, nd, bs+  LE with minimal edema, no cyanosis  Alert and oriented, moves all 4 extrem.  Derm: diffuse erythremic rash, extends over back, wrapping around trunk, extends to posterior arms.    Labs at discharge Lab Results  Component Value Date   CREATININE 1.93* 07/04/2013   BUN 37* 07/04/2013   NA 130* 07/04/2013   K 4.9 07/04/2013   CL 98 07/04/2013   CO2 18* 07/04/2013   Lab Results  Component Value Date   WBC 3.9* 07/04/2013   HGB 11.1* 07/04/2013   HCT 34.1* 07/04/2013   MCV 77.0* 07/04/2013   PLT 177 07/04/2013   Lab Results  Component Value Date   ALT 23 07/04/2013   AST 25 07/04/2013   ALKPHOS 228* 07/04/2013   BILITOT 0.3 07/04/2013   Lab Results  Component Value Date   INR 2.23* 07/04/2013   INR 1.67* 07/03/2013   INR 1.14 07/02/2013    Current radiology studies No results found.  Disposition:  Final discharge disposition not confirmed      Discharge Orders   Future Appointments Provider Department Dept Phone   07/05/2013 3:15 PM [facility] [place] [phone]   07/13/2013 2:00 PM Melvenia Needles, NP [facility] [phone]   07/17/2013 1:30 PM Tanda Rockers, MD [facility] [phone]   Future Orders Complete By Expires   Diet - low sodium heart healthy  As directed    Discharge instructions  As directed    Comments:     Be sure to contact the [facility] with the number below to establish local primary care  YOU MUST WEAR A MASK [place]    Increase activity slowly  As directed    Other Restrictions  As directed    Comments:     Mask as mentioned above       Medication List         azithromycin 600 MG tablet  Commonly known as:  ZITHROMAX  Take 2 tablets (1,200 mg total) by mouth once a week.     dapsone 100 MG tablet  Take 1 tablet (100 mg total) by mouth daily.     diphenhydrAMINE 25 MG tablet  Commonly known as:  BENADRYL  Take 1 tablet (25 mg total) by mouth every 6 (six) hours as needed.     enoxaparin 100 MG/ML injection  Commonly known as:  LOVENOX  Inject 0.9 mLs (  90 mg total) into the skin every 12 (twelve) hours.     ethambutol 400 MG tablet  Commonly known as:  MYAMBUTOL  Take 4 tablets (1,600 mg total) by mouth daily.     isoniazid 300 MG tablet  Commonly known as:  NYDRAZID  Take 1 tablet (300 mg total) by mouth daily.     pyrazinamide 500 MG tablet  Take 4 tablets (2,000 mg total) by mouth daily.     pyridOXINE 50 MG tablet  Commonly known as:  B-6  Take 1 tablet (50 mg total) by mouth daily.     rifampin 300 MG capsule  Commonly known as:  RIFADIN  Take 2 capsules (600 mg total) by mouth daily.     warfarin 5 MG tablet  Commonly known as:  COUMADIN  Alternate dose: take 10 mg (2 tabs) alternating with 7.5 mg (1/12 tabs) every other day. Start with 6m on 3/31       Follow-up Information   Please follow up. (Pt may contact VPine Levelsocial worker, RNadean Corwinat [phone] [phone] arrange [facility] followup or to establish a primary MD. )       Follow up with PARRETT,TAMMY, NP On 07/13/2013. (2pm )    Specialty:  Nurse Practitioner   Contact information:   [address] [phone]      Follow up with [facility] On 07/05/2013. (315pm)    Contact information:   [address] [place] [phone]       Follow up with MChristinia Gully MD On 07/17/2013. (130pm )    Specialty:  Pulmonary Disease   Contact information:   [address] [phone]      Discharged Condition: good  Physician Statement:   The Patient was personally examined, the discharge assessment and plan has been personally reviewed and I agree with ACNP Babcock's assessment and plan. > 30 minutes of time have been dedicated to discharge assessment, planning and discharge instructions.   Signed:Marni Griffon3/31/2015, 10:10 AM   MJillyn HiddenPCCM Pager: [phone]: [phone] no response, call [phone]

## 2013-07-04 NOTE — Care Management Note (Signed)
   CARE MANAGEMENT NOTE 07/04/2013  Patient:  David Fry, David Fry   Account Number:  [id number]  Date Initiated:  06/30/2013  Documentation initiated by:  Iman Orourke  Subjective/Objective Assessment:   CM to follow for progression and d/c planning.     Action/Plan:   06/30/2013 Met with pt re d/c needs. Pt now lives in [place] x 2 weeks as he came here for a job. He is currently working and living in a motel. Has [employer] but has not made contact with [facility] services in this area.   Anticipated DC Date:  07/06/2013   Anticipated DC Plan:  HOME/SELF CARE         Choice offered to / List presented to:             Status of service:  In process, will continue to follow Medicare Important Message given?   (If response is "NO", the following Medicare IM given date fields will be blank) Date Medicare IM given:   Date Additional Medicare IM given:    Discharge Disposition:  HOME/SELF CARE  Per UR Regulation:    If discussed at Long Length of Stay Meetings, dates discussed:    Comments:  07/03/2013 Noted TB positive, have asked charge RN to varify with ID that public health dept has been notified of this. Pt to d/c on Lovenox, cost to pt clarified with  pt insurance provider. Will follow for further d/c needs. Jasmine Pang RN MPH, case manager, [phone]   06/30/2013 Met with pt and discussed VA options in this area, pt provided with name of Social worker who can assist him at time of d/c with contacts and followup as needed. Pt has no DME needs or HH needs at this time. Will be followed by ID clinic. Jasmine Pang RN MPH, [phone], case manager.

## 2013-07-05 ENCOUNTER — Ambulatory Visit

## 2013-07-05 ENCOUNTER — Ambulatory Visit (INDEPENDENT_AMBULATORY_CARE_PROVIDER_SITE_OTHER): Admitting: General Practice

## 2013-07-05 DIAGNOSIS — I82409 Acute embolism and thrombosis of unspecified deep veins of unspecified lower extremity: Secondary | ICD-10-CM

## 2013-07-05 DIAGNOSIS — Z5181 Encounter for therapeutic drug level monitoring: Secondary | ICD-10-CM | POA: Insufficient documentation

## 2013-07-05 LAB — M. TUBERCULOSIS COMPLEX BY PCR: M TUBERCULOSISDIRECT: DETECTED — AB

## 2013-07-05 LAB — POCT INR: INR: 3.8

## 2013-07-05 NOTE — Patient Instructions (Signed)

## 2013-07-05 NOTE — Progress Notes (Signed)
Pre visit review using our clinic review tool, if applicable. No additional management support is needed unless otherwise documented below in the visit note. 

## 2013-07-12 ENCOUNTER — Telehealth: Payer: Self-pay | Admitting: Adult Health

## 2013-07-12 ENCOUNTER — Ambulatory Visit (INDEPENDENT_AMBULATORY_CARE_PROVIDER_SITE_OTHER): Admitting: General Practice

## 2013-07-12 DIAGNOSIS — Z5181 Encounter for therapeutic drug level monitoring: Secondary | ICD-10-CM

## 2013-07-12 DIAGNOSIS — I82409 Acute embolism and thrombosis of unspecified deep veins of unspecified lower extremity: Secondary | ICD-10-CM

## 2013-07-12 LAB — POCT INR: INR: 4.6

## 2013-07-12 NOTE — Progress Notes (Signed)
Pre visit review using our clinic review tool, if applicable. No additional management support is needed unless otherwise documented below in the visit note. 

## 2013-07-13 ENCOUNTER — Telehealth (HOSPITAL_BASED_OUTPATIENT_CLINIC_OR_DEPARTMENT_OTHER): Payer: Self-pay

## 2013-07-13 ENCOUNTER — Ambulatory Visit (INDEPENDENT_AMBULATORY_CARE_PROVIDER_SITE_OTHER)
Admission: RE | Admit: 2013-07-13 | Discharge: 2013-07-13 | Disposition: A | Discharge status: Home / Self Care | Source: Ambulatory Visit | Attending: Adult Health | Admitting: Adult Health

## 2013-07-13 ENCOUNTER — Ambulatory Visit (INDEPENDENT_AMBULATORY_CARE_PROVIDER_SITE_OTHER): Admitting: Adult Health

## 2013-07-13 ENCOUNTER — Encounter: Payer: Self-pay | Admitting: Adult Health

## 2013-07-13 ENCOUNTER — Other Ambulatory Visit (INDEPENDENT_AMBULATORY_CARE_PROVIDER_SITE_OTHER)

## 2013-07-13 VITALS — BP 122/68 | HR 88 | Temp 97.6°F | Ht 77.0 in | Wt 202.0 lb

## 2013-07-13 DIAGNOSIS — N179 Acute kidney failure, unspecified: Secondary | ICD-10-CM

## 2013-07-13 DIAGNOSIS — Z21 Asymptomatic human immunodeficiency virus [HIV] infection status: Secondary | ICD-10-CM

## 2013-07-13 DIAGNOSIS — B20 Human immunodeficiency virus [HIV] disease: Secondary | ICD-10-CM

## 2013-07-13 DIAGNOSIS — R21 Rash and other nonspecific skin eruption: Secondary | ICD-10-CM

## 2013-07-13 DIAGNOSIS — I10 Essential (primary) hypertension: Secondary | ICD-10-CM

## 2013-07-13 DIAGNOSIS — A150 Tuberculosis of lung: Secondary | ICD-10-CM

## 2013-07-13 DIAGNOSIS — J984 Other disorders of lung: Secondary | ICD-10-CM

## 2013-07-13 DIAGNOSIS — I82409 Acute embolism and thrombosis of unspecified deep veins of unspecified lower extremity: Secondary | ICD-10-CM

## 2013-07-13 DIAGNOSIS — L270 Generalized skin eruption due to drugs and medicaments taken internally: Secondary | ICD-10-CM

## 2013-07-13 DIAGNOSIS — J189 Pneumonia, unspecified organism: Secondary | ICD-10-CM

## 2013-07-13 LAB — BASIC METABOLIC PANEL
BUN: 68 mg/dL — ABNORMAL HIGH (ref 6–23)
CHLORIDE: 102 meq/L (ref 96–112)
CO2: 20 meq/L (ref 19–32)
Calcium: 9.5 mg/dL (ref 8.4–10.5)
Creatinine, Ser: 2.9 mg/dL — ABNORMAL HIGH (ref 0.4–1.5)
GFR: 24.14 mL/min — ABNORMAL LOW (ref >60.00)
Glucose, Bld: 89 mg/dL (ref 70–99)
POTASSIUM: 4.6 meq/L (ref 3.5–5.1)
Sodium: 130 mEq/L — ABNORMAL LOW (ref 135–145)

## 2013-07-13 NOTE — Progress Notes (Signed)
Subjective:    Patient ID: David Fry, male    DOB: [birth year], 58 y.o.   MRN: [id number]  HPI 58 year old male seen for initial pulmonary critical care consult during hospitalization. 06/26/2013 for cavitary pneumonia, lung mass, acute renal failure, and newly dx  AIDS and probable TB   07/13/2013 Post hospital followup 58 y.o. male from [place] recently relocated here with no known pmh who presented 3/23 with DOE, weakness, fatigue.  . Noted to have cavitary lung lesion on CT in [place], acute renal failure. Work up also revealed: DVT left peroneal vein, on 3/23 for which heparin gtt was started. Found to have  Positive HIV Ab, w/ CD4 50. Prophylactic azithro and bactrim was started:(Bactrim changed to dapsone d/t rash.  3/27. BAL completed on 3/26 was 3+ AFB:  Mycobacterium tuberculosis complex  health department was notified, and started BJ:YNWGNFAOon:Rifampin, pyrazinamide, isoniazid and ethabutol also on 3/27. Cytology was negative for malignant cells. Coumadin started on 3/29.  Seen by ID w/ following recs : defer treatment with concern for Tuberculosis for 2 weeks on RIPE. He is on rifampin so will consider Tivicay bid + Truvada or Atripla (per ID)  -follow up w/ ID 4/20. -l continue on azithromycin 1200 mg po weekly and dapson 100 mg daily  Since discharge feeling some better but very weak.  INR yesterday 4.6 , adjusted by CC. No known bleeding.  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  [place]  Was also in [employer].  He remain on TB 4 drug therapy. Contacted case worker at HD . Pt is taking. Now off mask therapy with 3 neg afb smears since tx started.  He has not started aztihro or dapsone as insurance will not cover at pharm, need to change pharmacies.   No chest pain , edema or n/v/d/ , hemotpysis,.  Rash is improving .      Review of Systems Constitutional:   No  weight loss, night sweats,  Fevers, chills,  +fatigue, or  lassitude.  HEENT:    No headaches,  Difficulty swallowing,  Tooth/dental problems, or  Sore throat,                No sneezing, itching, ear ache, nasal congestion, post nasal drip,   CV:  No chest pain,  Orthopnea, PND, swelling in lower extremities, anasarca, dizziness, palpitations, syncope.   GI  No heartburn, indigestion, abdominal pain, nausea, vomiting, diarrhea, change in bowel habits, loss of appetite, bloody stools.   Resp:    No excess mucus, no productive cough,  No non-productive cough,  No coughing up of blood.  No change in color of mucus.  No wheezing.  No chest wall deformity  Skin: + rash   GU: no dysuria, change in color of urine, no urgency or frequency.  No flank pain, no hematuria   MS:  No joint pain or swelling.  No decreased range of motion.  No back pain.  Psych:  No change in mood or affect. No depression or anxiety.  No memory loss.         Objective:   Physical Exam GEN: A/Ox3; pleasant , NAD, ill appearing  HEENT:  [place]/AT,  EACs-clear, TMs-wnl, NOSE-clear, THROAT-clear, no lesions, no postnasal drip or exudate noted.   NECK:  Supple w/ fair ROM; no JVD; normal carotid impulses w/o bruits; no thyromegaly or nodules palpated; no lymphadenopathy.  RESP  Clear  P & A; w/o, wheezes/  rales/ or rhonchi.no accessory muscle use, no dullness to percussion  CARD:  RRR, no m/r/g  , no peripheral edema, pulses intact, no cyanosis or clubbing.  GI:   Soft & nt; nml bowel sounds; no organomegaly or masses detected.  Musco: Warm bil, no deformities or joint swelling noted.   Neuro: alert, no focal deficits noted.    Skin: Warm, no lesions or rashes         Assessment & Plan:

## 2013-07-13 NOTE — Progress Notes (Signed)
   Subjective:    Patient ID: David Fry, male    DOB: 1955-06-17, 58 y.o.   MRN: [id number]  HPI    Review of Systems  Constitutional: Negative for fever and unexpected weight change.  HENT: Negative for congestion, dental problem, ear pain, nosebleeds, postnasal drip, rhinorrhea, sinus pressure, sneezing, sore throat and trouble swallowing.   Eyes: Negative for redness and itching.  Respiratory: Positive for shortness of breath. Negative for cough, chest tightness and wheezing.   Cardiovascular: Negative for palpitations and leg swelling.  Gastrointestinal: Negative for nausea and vomiting.  Genitourinary: Negative for dysuria.  Musculoskeletal: Negative for joint swelling.  Skin: Positive for rash.  Neurological: Negative for headaches.  Hematological: Does not bruise/bleed easily.  Psychiatric/Behavioral: Negative for dysphoric mood. The patient is not nervous/anxious.        Objective:   Physical Exam        Assessment & Plan:

## 2013-07-13 NOTE — Telephone Encounter (Signed)
Error

## 2013-07-13 NOTE — Patient Instructions (Signed)
Chest xray today  Labs today  I will call regarding med issues  follow up with Dr. Sherene SiresWert  Next week as planned and As needed   follow up with ID clinic as planned  Continue on coumadin w/ coumadin clinic checks Please contact office for sooner follow up if symptoms do not improve or worsen or seek emergency care

## 2013-07-14 DIAGNOSIS — A150 Tuberculosis of lung: Secondary | ICD-10-CM | POA: Insufficient documentation

## 2013-07-14 LAB — HLA B*5701: HLA B 5701: NEGATIVE

## 2013-07-14 NOTE — Assessment & Plan Note (Signed)
Cont on coumadin per CC.

## 2013-07-14 NOTE — Assessment & Plan Note (Signed)
Cx return with BAL completed on 3/26 was 3+ AFB:  Mycobacterium tuberculosis complex   Plan  Cont w/ Health dept following Pt to cont on 4 drug therapy.

## 2013-07-14 NOTE — Assessment & Plan Note (Addendum)
Repeat cxr today  Cont TB tx regimen  Recent neg smear x 3 on therapy, ok per health dept for off mask in public . Follow final AFB culture data.   Needs to start azithro and dapsone

## 2013-07-14 NOTE — Assessment & Plan Note (Addendum)
Check bmet today  Consider referral to nephrology if not improving  Avoid nephrotoxins.

## 2013-07-14 NOTE — Assessment & Plan Note (Signed)
Need to start dapsone and azith  follow up with ID clinic in 2 weeks as planned.  Pt educaiton given  [place] [facility]

## 2013-07-14 NOTE — Assessment & Plan Note (Signed)
Improving.

## 2013-07-17 ENCOUNTER — Ambulatory Visit (INDEPENDENT_AMBULATORY_CARE_PROVIDER_SITE_OTHER): Admitting: Internal Medicine

## 2013-07-17 ENCOUNTER — Encounter: Payer: Self-pay | Admitting: Internal Medicine

## 2013-07-17 VITALS — HR 88 | Temp 98.0°F | Ht 77.0 in | Wt 199.4 lb

## 2013-07-17 DIAGNOSIS — N179 Acute kidney failure, unspecified: Secondary | ICD-10-CM

## 2013-07-17 DIAGNOSIS — I82409 Acute embolism and thrombosis of unspecified deep veins of unspecified lower extremity: Secondary | ICD-10-CM

## 2013-07-17 DIAGNOSIS — A150 Tuberculosis of lung: Secondary | ICD-10-CM

## 2013-07-17 NOTE — Progress Notes (Signed)
Subjective:    Patient ID: David Fry, male    DOB: 1955-08-01, 58 y.o.   MRN: [id number]  Brief patient profile:  [id number] yowm active smoker  seen for initial pulmonary critical care consult during hospitalization. 06/26/2013 for cavitary pneumonia, lung mass, acute renal failure, and newly dx  AIDS and probable TB   07/13/2013 Post hospital followup 58 y.o. male from [place] recently relocated here with no known pmh who presented 3/23 with DOE, weakness, fatigue.  . Noted to have cavitary lung lesion on CT in [place], acute renal failure. Work up also revealed: DVT left peroneal vein, on 3/23 for which heparin gtt was started. Found to have  Positive HIV Ab, w/ CD4 50. Prophylactic azithro and bactrim  started:(Bactrim changed to dapsone d/t rash.  3/27. BAL completed on 3/26 was 3+ AFB:  Mycobacterium tuberculosis complex  health department was notified, and started WU:JWJXBJYNon:Rifampin, pyrazinamide, isoniazid and ethabutol also on 3/27. Cytology was negative for malignant cells. Coumadin started on 3/29.  Seen by ID w/ following recs : defer treatment with concern for Tuberculosis for 2 weeks on RIPE. He is on rifampin so will consider Tivicay bid + Truvada or Atripla (per ID)  -follow up w/ ID 4/20. -l continue on azithromycin 1200 mg po weekly and dapsone 100 mg daily  Since discharge feeling some better but very weak.  INR one day prior to OV  4.6 , adjusted by CC. No known bleeding.  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  [place]  Was also in [employer].  He remain on TB 4 drug therapy. Contacted case worker at HD . Pt is taking. Now off mask therapy with 3 neg afb smears since tx started.  He has not started aztihro or dapsone as insurance will not cover at pharm, need to change pharmacies.  rec No change rex Add  Creat up from baseline to 2.4> f/u needed   07/17/2013 f/u ov/Dadrian Ballantine re: TB/ DVT/ Renal insufficiency Chief Complaint  Patient presents with   . Follow-up    Pt reports his breathing is unchanged since last visit. No new co's today.  denies use of nsaids, no problem at all with urination, planning to leave for [place] p ID eval 07/24/13 No sign cough     No obvious day to day or daytime variabilty or assoc cp or chest tightness, subjective wheeze overt sinus or hb symptoms. No unusual exp hx or h/o childhood pna/ asthma or knowledge of premature birth.  Sleeping ok without nocturnal  or early am exacerbation  of respiratory  c/o's or need for noct saba. Also denies any obvious fluctuation of symptoms with weather or environmental changes or other aggravating or alleviating factors except as outlined above   Current Medications, Allergies, Complete Past Medical History, Past Surgical History, Family History, and Social History were reviewed in [facility] electronic medical record.  ROS  The following are not active complaints unless bolded sore throat, dysphagia, dental problems, itching, sneezing,  nasal congestion or excess/ purulent secretions, ear ache,   fever, chills, sweats, unintended wt loss, pleuritic or exertional cp, hemoptysis,  orthopnea pnd or leg swelling, presyncope, palpitations, heartburn, abdominal pain, anorexia, nausea, vomiting, diarrhea  or change in bowel or urinary habits, change in stools or urine, dysuria,hematuria,  rash, arthralgias, visual complaints, headache, numbness weakness or ataxia or problems with walking or coordination,  change in mood/affect or memory.  Objective:   Physical Exam GEN: A/Ox3; pleasant , NAD, ill appearing, sits slumped over with chin almost touching his chest  Wt Readings from Last 3 Encounters:  07/17/13 199 lb 6.4 oz (90.447 kg)  07/13/13 202 lb (91.627 kg)  07/03/13 209 lb 3.5 oz (94.9 kg)      HEENT:  [place]/AT,  EACs-clear, TMs-wnl, NOSE-clear, THROAT-clear, no lesions, no postnasal drip or exudate noted.   NECK:  Supple w/ fair ROM; no  JVD; normal carotid impulses w/o bruits; no thyromegaly or nodules palpated; no lymphadenopathy.  RESP  Clear  P & A; w/o, wheezes/ rales/ or rhonchi.no accessory muscle use, no dullness to percussion  CARD:  RRR, no m/r/g  , no peripheral edema, pulses intact, no cyanosis - Pos moderate clubbing   GI:   Soft & nt; nml bowel sounds; no organomegaly or masses detected.  Musco: Warm bil, no deformities or joint swelling noted.   Neuro: alert, no focal deficits noted.    Skin: Warm, no lesions or rashes     Recent Labs Lab 07/13/13 1447  NA 130*  K 4.6  CL 102  CO2 20  BUN 68*  CREATININE 2.9*  GLUCOSE 89    chest X-ray 07/13/13  Persistent change in the right apex with increasing cavitation. No  new focal abnormality is noted.     Assessment & Plan:

## 2013-07-17 NOTE — Patient Instructions (Addendum)
Call for your records when you reach [place].    You will need your labs rechecked by Dr Ninetta LightsHatcher when you return to ID clinic in one week   Pulmonary follow up is at the discretion of Dr Ninetta LightsHatcher but if you quit smoking now, probably won't be needed

## 2013-07-18 NOTE — Assessment & Plan Note (Signed)
Venous dopplers 06/26/13  >>> Findings consistent with acute deep vein thrombosis involving the left peroneal vein.  Ok on R  Clinically resolved but will need coumadin thru 09/26/13 and then repeat venous dopplers before d/c

## 2013-07-18 NOTE — Assessment & Plan Note (Addendum)
BAL completed on 3/26 was 3+ AFB:  Mycobacterium tuberculosis complex  >tx w/ 4 drug therapy, followed by Health Dept   He is clinically better though cxr suggests cavity slt worse (not unexpected acutely ) but needs F/u per ID/ health dept here or in [place] but will defer coordination of care to ID/health dept and here prn if still in town.

## 2013-07-18 NOTE — Assessment & Plan Note (Addendum)
Lab Results  Component Value Date   CREATININE 2.9* 07/13/2013   CREATININE 1.93* 07/04/2013   CREATININE 2.06* 07/03/2013    U/S  06/26/13  Unremarkable renal ultrasound.    He has had no difficulty with urination or exposure to toxins per pt/ chart review (no contrast) so this may well be chronic but def needs f/u either her or in [place]

## 2013-07-19 ENCOUNTER — Ambulatory Visit (INDEPENDENT_AMBULATORY_CARE_PROVIDER_SITE_OTHER): Admitting: General Practice

## 2013-07-19 DIAGNOSIS — I82409 Acute embolism and thrombosis of unspecified deep veins of unspecified lower extremity: Secondary | ICD-10-CM

## 2013-07-19 DIAGNOSIS — Z5181 Encounter for therapeutic drug level monitoring: Secondary | ICD-10-CM

## 2013-07-19 LAB — POCT INR: INR: 1.2

## 2013-07-19 NOTE — Progress Notes (Signed)
Pre visit review using our clinic review tool, if applicable. No additional management support is needed unless otherwise documented below in the visit note. 

## 2013-07-21 NOTE — Progress Notes (Signed)
Quick Note:  Pt aware ______ 

## 2013-07-24 ENCOUNTER — Encounter: Payer: Self-pay | Admitting: Infectious Diseases

## 2013-07-24 ENCOUNTER — Ambulatory Visit (INDEPENDENT_AMBULATORY_CARE_PROVIDER_SITE_OTHER): Admitting: Infectious Diseases

## 2013-07-24 VITALS — BP 100/73 | HR 108 | Temp 97.7°F | Ht 77.0 in | Wt 198.0 lb

## 2013-07-24 DIAGNOSIS — B370 Candidal stomatitis: Secondary | ICD-10-CM | POA: Insufficient documentation

## 2013-07-24 DIAGNOSIS — Z23 Encounter for immunization: Secondary | ICD-10-CM

## 2013-07-24 DIAGNOSIS — Z933 Colostomy status: Secondary | ICD-10-CM

## 2013-07-24 DIAGNOSIS — Z21 Asymptomatic human immunodeficiency virus [HIV] infection status: Secondary | ICD-10-CM

## 2013-07-24 DIAGNOSIS — A150 Tuberculosis of lung: Secondary | ICD-10-CM

## 2013-07-24 DIAGNOSIS — B20 Human immunodeficiency virus [HIV] disease: Secondary | ICD-10-CM

## 2013-07-24 LAB — FUNGUS CULTURE W SMEAR: Fungal Smear: NONE SEEN

## 2013-07-24 MED ORDER — FLUCONAZOLE 100 MG PO TABS: 100.0000 mg | ORAL_TABLET | Freq: Every day | ORAL | Status: DC

## 2013-07-24 MED ORDER — ABACAVIR-DOLUTEGRAVIR-LAMIVUD 600-50-300 MG PO TABS: 1.0000 | ORAL_TABLET | Freq: Every day | ORAL | Status: DC
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
## 2013-07-24 NOTE — Addendum Note (Signed)
Addended by: Wendall MolaOCKERHAM, Cylan Borum A on: 07/24/2013 10:12 AM   Modules accepted: Orders

## 2013-07-24 NOTE — Assessment & Plan Note (Signed)
Appears to be doing well. Will continue to f/u with health dept.

## 2013-07-24 NOTE — Assessment & Plan Note (Signed)
Will have him seen in [facility].

## 2013-07-24 NOTE — Assessment & Plan Note (Addendum)
Has not had sex partner in 5 years. Will start him on epzicom, DTGV/ triumeq. His last CrCl pushes him out of FDC qualification however his prev was ok. Offered/refuses condoms. Will see him back in 1 month

## 2013-07-24 NOTE — Assessment & Plan Note (Signed)
Will give him 10 days of fluconazole.

## 2013-07-25 ENCOUNTER — Telehealth: Payer: Self-pay | Admitting: Licensed Clinical Social Worker

## 2013-07-25 DIAGNOSIS — B20 Human immunodeficiency virus [HIV] disease: Secondary | ICD-10-CM

## 2013-07-25 MED ORDER — DOLUTEGRAVIR SODIUM 50 MG PO TABS: 50.0000 mg | ORAL_TABLET | Freq: Every day | ORAL | Status: DC

## 2013-07-25 MED ORDER — ABACAVIR SULFATE-LAMIVUDINE 600-300 MG PO TABS: 1.0000 | ORAL_TABLET | Freq: Every day | ORAL | Status: DC

## 2013-07-25 NOTE — Telephone Encounter (Signed)
[facility] notified us that patient's insurance will not cover Triumeq, but would cover Epzicom and tivicay. [facility] ok to change and  forward changes to  Dr. Ninetta LightsHatcher.

## 2013-07-26 ENCOUNTER — Ambulatory Visit (INDEPENDENT_AMBULATORY_CARE_PROVIDER_SITE_OTHER): Admitting: General Practice

## 2013-07-26 DIAGNOSIS — Z5181 Encounter for therapeutic drug level monitoring: Secondary | ICD-10-CM

## 2013-07-26 DIAGNOSIS — I82409 Acute embolism and thrombosis of unspecified deep veins of unspecified lower extremity: Secondary | ICD-10-CM

## 2013-07-26 LAB — POCT INR: INR: 1.1

## 2013-07-26 NOTE — Progress Notes (Signed)
Pre visit review using our clinic review tool, if applicable. No additional management support is needed unless otherwise documented below in the visit note. 

## 2013-08-02 ENCOUNTER — Ambulatory Visit (INDEPENDENT_AMBULATORY_CARE_PROVIDER_SITE_OTHER): Admitting: General Practice

## 2013-08-02 ENCOUNTER — Encounter (HOSPITAL_BASED_OUTPATIENT_CLINIC_OR_DEPARTMENT_OTHER)

## 2013-08-02 DIAGNOSIS — Z5181 Encounter for therapeutic drug level monitoring: Secondary | ICD-10-CM

## 2013-08-02 DIAGNOSIS — I82409 Acute embolism and thrombosis of unspecified deep veins of unspecified lower extremity: Secondary | ICD-10-CM

## 2013-08-02 LAB — POCT INR: INR: 1

## 2013-08-02 NOTE — Progress Notes (Signed)
Pre visit review using our clinic review tool, if applicable. No additional management support is needed unless otherwise documented below in the visit note. 

## 2013-08-08 ENCOUNTER — Other Ambulatory Visit: Payer: Self-pay | Admitting: *Deleted

## 2013-08-08 ENCOUNTER — Telehealth: Payer: Self-pay | Admitting: *Deleted

## 2013-08-08 DIAGNOSIS — B20 Human immunodeficiency virus [HIV] disease: Secondary | ICD-10-CM

## 2013-08-08 MED ORDER — DOLUTEGRAVIR SODIUM 50 MG PO TABS: 50.0000 mg | ORAL_TABLET | Freq: Two times a day (BID) | ORAL | Status: DC

## 2013-08-08 NOTE — Telephone Encounter (Signed)
One of the TB nurses, Ena Dawleyiane Nowack, visited Mr. David Fry today to give DOT for TB.  She reported back to me his new HIV meds and doses.  The patient is on Rifampin so, if dolutegravir (tivicay) is to be used, the dolutegravir needs to be increased to 50mg  po BID.  He is on 50 mg po QD.  There are no interactions between rifampin and ABC/3TC so we don't need to worry about those.  Can you work with your clinic to get the patient's dolutegravir dose adjusted ASAP?  Patient notified and [facility]. David Fry

## 2013-08-08 NOTE — Telephone Encounter (Signed)
Thank you Jackie

## 2013-08-09 ENCOUNTER — Ambulatory Visit (INDEPENDENT_AMBULATORY_CARE_PROVIDER_SITE_OTHER): Admitting: General Practice

## 2013-08-09 ENCOUNTER — Other Ambulatory Visit: Payer: Self-pay | Admitting: General Practice

## 2013-08-09 DIAGNOSIS — Z5181 Encounter for therapeutic drug level monitoring: Secondary | ICD-10-CM

## 2013-08-09 DIAGNOSIS — I82409 Acute embolism and thrombosis of unspecified deep veins of unspecified lower extremity: Secondary | ICD-10-CM

## 2013-08-09 LAB — POCT INR: INR: 1.1

## 2013-08-09 MED ORDER — WARFARIN SODIUM 5 MG PO TABS: ORAL_TABLET | ORAL | Status: DC

## 2013-08-09 NOTE — Progress Notes (Signed)
Pre-visit discussion using our clinic review tool. No additional management support is needed unless otherwise documented below in the visit note.  

## 2013-08-11 ENCOUNTER — Encounter: Payer: Self-pay | Admitting: Infectious Diseases

## 2013-08-15 ENCOUNTER — Ambulatory Visit (INDEPENDENT_AMBULATORY_CARE_PROVIDER_SITE_OTHER): Admitting: Family Medicine

## 2013-08-15 DIAGNOSIS — Z5181 Encounter for therapeutic drug level monitoring: Secondary | ICD-10-CM

## 2013-08-15 DIAGNOSIS — I82409 Acute embolism and thrombosis of unspecified deep veins of unspecified lower extremity: Secondary | ICD-10-CM

## 2013-08-15 LAB — POCT INR: INR: 1.2

## 2013-08-23 ENCOUNTER — Ambulatory Visit (INDEPENDENT_AMBULATORY_CARE_PROVIDER_SITE_OTHER): Admitting: General Practice

## 2013-08-23 DIAGNOSIS — Z5181 Encounter for therapeutic drug level monitoring: Secondary | ICD-10-CM

## 2013-08-23 DIAGNOSIS — I82409 Acute embolism and thrombosis of unspecified deep veins of unspecified lower extremity: Secondary | ICD-10-CM

## 2013-08-23 LAB — POCT INR: INR: 1.5

## 2013-08-23 NOTE — Progress Notes (Signed)
Pre visit review using our clinic review tool, if applicable. No additional management support is needed unless otherwise documented below in the visit note. 

## 2013-08-30 ENCOUNTER — Ambulatory Visit (INDEPENDENT_AMBULATORY_CARE_PROVIDER_SITE_OTHER): Admitting: Family Medicine

## 2013-08-30 DIAGNOSIS — I82409 Acute embolism and thrombosis of unspecified deep veins of unspecified lower extremity: Secondary | ICD-10-CM

## 2013-08-30 DIAGNOSIS — Z5181 Encounter for therapeutic drug level monitoring: Secondary | ICD-10-CM

## 2013-08-30 LAB — POCT INR: INR: 5.3

## 2013-09-05 ENCOUNTER — Encounter: Payer: Self-pay | Admitting: Infectious Diseases

## 2013-09-05 ENCOUNTER — Ambulatory Visit (INDEPENDENT_AMBULATORY_CARE_PROVIDER_SITE_OTHER): Admitting: Infectious Diseases

## 2013-09-05 VITALS — BP 108/69 | HR 61 | Temp 97.9°F | Ht 77.0 in | Wt 221.0 lb

## 2013-09-05 DIAGNOSIS — Z23 Encounter for immunization: Secondary | ICD-10-CM

## 2013-09-05 DIAGNOSIS — I82409 Acute embolism and thrombosis of unspecified deep veins of unspecified lower extremity: Secondary | ICD-10-CM

## 2013-09-05 DIAGNOSIS — B20 Human immunodeficiency virus [HIV] disease: Secondary | ICD-10-CM

## 2013-09-05 DIAGNOSIS — Z933 Colostomy status: Secondary | ICD-10-CM

## 2013-09-05 DIAGNOSIS — Z21 Asymptomatic human immunodeficiency virus [HIV] infection status: Secondary | ICD-10-CM

## 2013-09-05 DIAGNOSIS — N179 Acute kidney failure, unspecified: Secondary | ICD-10-CM

## 2013-09-05 DIAGNOSIS — A150 Tuberculosis of lung: Secondary | ICD-10-CM

## 2013-09-05 LAB — AFB CULTURE WITH SMEAR (NOT AT ARMC)

## 2013-09-05 NOTE — Addendum Note (Signed)
Addended by: Wendall Mola A on: 09/05/2013 02:50 PM   Modules accepted: Orders

## 2013-09-05 NOTE — Assessment & Plan Note (Signed)
Continues to follow with health dept. Will check his LFTs today.

## 2013-09-05 NOTE — Assessment & Plan Note (Signed)
Offered to send him to suregery for eval for closure, he is resigned to have it "til my dying days".

## 2013-09-05 NOTE — Progress Notes (Signed)
   Subjective:    Patient ID: David Fry, male    DOB: 07/22/1955, 58 y.o.   MRN: [id number]  HPI 58 yo M with who presented 06/26/13 with DOE, weakness, fatigue. Noted to have cavitary lung lesion on CT in [place], acute renal failure. Work up also revealed positive HIV+. 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 He was started on RPIE as well as dapsone and azithro. He was also found to have LLE DVT. He was d/c home on 07/04/13. He is currently on Isoniazid, Rifampin, B6.  Has gained 20# since last visit. Eating well. No changes in sensation in his feet (had previous numbness in his RLE). Still on coumadin, has RLE edema. No SOB or cough.  No problems with ART, no noted side effects.   HIV 1 RNA Quant (copies/mL)  Date Value  06/28/2013 [id number]*     CD4 T Cell Abs (/uL)  Date Value  06/28/2013 50*   Has been followed at Coumadin clinic.   Review of Systems  Constitutional: Negative for fever, chills, appetite change and unexpected weight change.  HENT: Negative for nosebleeds.   Respiratory: Negative for cough and shortness of breath.   Cardiovascular: Positive for leg swelling.  Gastrointestinal: Negative for diarrhea and constipation.  Genitourinary: Negative for difficulty urinating.       Objective:   Physical Exam  Constitutional: He appears well-developed and well-nourished.  HENT:  Mouth/Throat: No oropharyngeal exudate.  Eyes: EOM are normal. Pupils are equal, round, and reactive to light.  Neck: Neck supple.  Cardiovascular: Normal rate, regular rhythm and normal heart sounds.   Pulmonary/Chest: Effort normal and breath sounds normal.  Abdominal: Soft. Bowel sounds are normal. He exhibits no distension. There is no tenderness.  Musculoskeletal:       Feet:  Lymphadenopathy:    He has no cervical adenopathy.          Assessment & Plan:

## 2013-09-05 NOTE — Assessment & Plan Note (Signed)
Will repeat his Cr today 

## 2013-09-05 NOTE — Assessment & Plan Note (Addendum)
Gets Hep B #2 today. Will check his labs today. Will see him back in 6 months. He will change tivicay to qday when rifampin stops. Offered/refuses condoms.

## 2013-09-05 NOTE — Assessment & Plan Note (Signed)
Will continue to follow up at Coumadin clinic.

## 2013-09-06 ENCOUNTER — Ambulatory Visit (INDEPENDENT_AMBULATORY_CARE_PROVIDER_SITE_OTHER): Admitting: General Practice

## 2013-09-06 DIAGNOSIS — Z5181 Encounter for therapeutic drug level monitoring: Secondary | ICD-10-CM

## 2013-09-06 DIAGNOSIS — I82409 Acute embolism and thrombosis of unspecified deep veins of unspecified lower extremity: Secondary | ICD-10-CM

## 2013-09-06 LAB — COMPREHENSIVE METABOLIC PANEL
ALT: 10 U/L (ref 0–53)
AST: 12 U/L (ref 0–37)
Albumin: 3.1 g/dL — ABNORMAL LOW (ref 3.5–5.2)
Alkaline Phosphatase: 77 U/L (ref 39–117)
BUN: 21 mg/dL (ref 6–23)
CO2: 22 mEq/L (ref 19–32)
Calcium: 8.5 mg/dL (ref 8.4–10.5)
Chloride: 108 mEq/L (ref 96–112)
Creat: 1.08 mg/dL (ref 0.50–1.35)
GLUCOSE: 108 mg/dL — AB (ref 70–99)
Potassium: 4.4 mEq/L (ref 3.5–5.3)
SODIUM: 137 meq/L (ref 135–145)
TOTAL PROTEIN: 6.7 g/dL (ref 6.0–8.3)
Total Bilirubin: 0.3 mg/dL (ref 0.2–1.2)

## 2013-09-06 LAB — CBC
HCT: 33.7 % — ABNORMAL LOW (ref 39.0–52.0)
HEMOGLOBIN: 11.1 g/dL — AB (ref 13.0–17.0)
MCH: 29.6 pg (ref 26.0–34.0)
MCHC: 32.9 g/dL (ref 30.0–36.0)
MCV: 89.9 fL (ref 78.0–100.0)
PLATELETS: 133 10*3/uL — AB (ref 150–400)
RBC: 3.75 MIL/uL — ABNORMAL LOW (ref 4.22–5.81)
RDW: 25.6 % — ABNORMAL HIGH (ref 11.5–15.5)
WBC: 3.8 10*3/uL — ABNORMAL LOW (ref 4.0–10.5)

## 2013-09-06 LAB — T-HELPER CELL (CD4) - (RCID CLINIC ONLY)
CD4 % Helper T Cell: 6 % — ABNORMAL LOW (ref 33–55)
CD4 T Cell Abs: 70 /uL — ABNORMAL LOW (ref 400–2700)

## 2013-09-06 LAB — POCT INR: INR: 1.9

## 2013-09-06 NOTE — Progress Notes (Signed)
Pre visit review using our clinic review tool, if applicable. No additional management support is needed unless otherwise documented below in the visit note. 

## 2013-09-07 ENCOUNTER — Other Ambulatory Visit: Payer: Self-pay | Admitting: General Practice

## 2013-09-07 LAB — HIV-1 RNA QUANT-NO REFLEX-BLD
HIV 1 RNA QUANT: 162 {copies}/mL — AB (ref <20)
HIV-1 RNA Quant, Log: 2.21 {Log} — ABNORMAL HIGH (ref <1.30)

## 2013-09-07 MED ORDER — WARFARIN SODIUM 5 MG PO TABS: ORAL_TABLET | ORAL | Status: DC

## 2013-09-08 ENCOUNTER — Other Ambulatory Visit: Payer: Self-pay | Admitting: Infectious Disease

## 2013-09-08 ENCOUNTER — Ambulatory Visit
Admission: RE | Admit: 2013-09-08 | Discharge: 2013-09-08 | Disposition: A | Discharge status: Home / Self Care | Payer: No Typology Code available for payment source | Source: Ambulatory Visit | Attending: Infectious Disease | Admitting: Infectious Disease

## 2013-09-08 DIAGNOSIS — Z09 Encounter for follow-up examination after completed treatment for conditions other than malignant neoplasm: Secondary | ICD-10-CM

## 2013-09-13 ENCOUNTER — Ambulatory Visit (INDEPENDENT_AMBULATORY_CARE_PROVIDER_SITE_OTHER): Admitting: General Practice

## 2013-09-13 DIAGNOSIS — I82409 Acute embolism and thrombosis of unspecified deep veins of unspecified lower extremity: Secondary | ICD-10-CM

## 2013-09-13 DIAGNOSIS — Z5181 Encounter for therapeutic drug level monitoring: Secondary | ICD-10-CM

## 2013-09-13 LAB — POCT INR: INR: 2.1

## 2013-09-13 NOTE — Progress Notes (Signed)
Pre visit review using our clinic review tool, if applicable. No additional management support is needed unless otherwise documented below in the visit note. 

## 2013-09-27 ENCOUNTER — Ambulatory Visit (INDEPENDENT_AMBULATORY_CARE_PROVIDER_SITE_OTHER): Admitting: General Practice

## 2013-09-27 DIAGNOSIS — I82409 Acute embolism and thrombosis of unspecified deep veins of unspecified lower extremity: Secondary | ICD-10-CM

## 2013-09-27 DIAGNOSIS — Z5181 Encounter for therapeutic drug level monitoring: Secondary | ICD-10-CM

## 2013-09-27 LAB — POCT INR: INR: 1.4

## 2013-09-27 NOTE — Progress Notes (Signed)
Pre visit review using our clinic review tool, if applicable. No additional management support is needed unless otherwise documented below in the visit note. 

## 2013-09-28 ENCOUNTER — Telehealth: Payer: Self-pay | Admitting: *Deleted

## 2013-09-28 NOTE — Telephone Encounter (Signed)
Called patient and left a voice mail to call the clinic to schedule a follow up appt with Dr. Ninetta LightsHatcher in October. David Fry

## 2013-10-18 ENCOUNTER — Ambulatory Visit (INDEPENDENT_AMBULATORY_CARE_PROVIDER_SITE_OTHER): Admitting: General Practice

## 2013-10-18 DIAGNOSIS — Z5181 Encounter for therapeutic drug level monitoring: Secondary | ICD-10-CM

## 2013-10-18 DIAGNOSIS — I82409 Acute embolism and thrombosis of unspecified deep veins of unspecified lower extremity: Secondary | ICD-10-CM

## 2013-10-18 LAB — POCT INR: INR: 1.5

## 2013-10-18 NOTE — Progress Notes (Signed)
Pre visit review using our clinic review tool, if applicable. No additional management support is needed unless otherwise documented below in the visit note. 

## 2013-11-01 ENCOUNTER — Ambulatory Visit (INDEPENDENT_AMBULATORY_CARE_PROVIDER_SITE_OTHER): Admitting: General Practice

## 2013-11-01 DIAGNOSIS — I82409 Acute embolism and thrombosis of unspecified deep veins of unspecified lower extremity: Secondary | ICD-10-CM

## 2013-11-01 DIAGNOSIS — Z5181 Encounter for therapeutic drug level monitoring: Secondary | ICD-10-CM

## 2013-11-01 LAB — POCT INR: INR: 1.4

## 2013-11-01 NOTE — Progress Notes (Signed)
Pre visit review using our clinic review tool, if applicable. No additional management support is needed unless otherwise documented below in the visit note. 

## 2013-11-15 ENCOUNTER — Ambulatory Visit (INDEPENDENT_AMBULATORY_CARE_PROVIDER_SITE_OTHER): Admitting: Family Medicine

## 2013-11-15 DIAGNOSIS — I82409 Acute embolism and thrombosis of unspecified deep veins of unspecified lower extremity: Secondary | ICD-10-CM

## 2013-11-15 DIAGNOSIS — Z5181 Encounter for therapeutic drug level monitoring: Secondary | ICD-10-CM

## 2013-11-15 LAB — POCT INR: INR: 1.4

## 2013-11-29 ENCOUNTER — Ambulatory Visit (INDEPENDENT_AMBULATORY_CARE_PROVIDER_SITE_OTHER): Admitting: *Deleted

## 2013-11-29 DIAGNOSIS — Z5181 Encounter for therapeutic drug level monitoring: Secondary | ICD-10-CM

## 2013-11-29 DIAGNOSIS — I82409 Acute embolism and thrombosis of unspecified deep veins of unspecified lower extremity: Secondary | ICD-10-CM

## 2013-11-29 LAB — POCT INR: INR: 1.4

## 2013-12-08 ENCOUNTER — Telehealth: Payer: Self-pay | Admitting: Certified Registered Nurse Anesthetist

## 2013-12-08 NOTE — Telephone Encounter (Signed)
Called and attempted to schedule an appointment with patient. Currently he is having his INR checked at our coumadin clinic. We can not continue to do his coumadin checks without him having a Primary Care Physician within [place].

## 2013-12-13 ENCOUNTER — Other Ambulatory Visit: Payer: Self-pay | Admitting: Infectious Diseases

## 2013-12-13 ENCOUNTER — Ambulatory Visit (INDEPENDENT_AMBULATORY_CARE_PROVIDER_SITE_OTHER): Admitting: *Deleted

## 2013-12-13 DIAGNOSIS — I82409 Acute embolism and thrombosis of unspecified deep veins of unspecified lower extremity: Secondary | ICD-10-CM

## 2013-12-13 DIAGNOSIS — B20 Human immunodeficiency virus [HIV] disease: Secondary | ICD-10-CM

## 2013-12-13 DIAGNOSIS — Z5181 Encounter for therapeutic drug level monitoring: Secondary | ICD-10-CM

## 2013-12-13 LAB — POCT INR: INR: 1.4

## 2013-12-27 ENCOUNTER — Encounter

## 2014-01-01 ENCOUNTER — Ambulatory Visit
Admission: RE | Admit: 2014-01-01 | Discharge: 2014-01-01 | Disposition: A | Discharge status: Home / Self Care | Payer: No Typology Code available for payment source | Source: Ambulatory Visit | Attending: Infectious Disease | Admitting: Infectious Disease

## 2014-01-01 ENCOUNTER — Other Ambulatory Visit: Payer: Self-pay | Admitting: Infectious Disease

## 2014-01-01 DIAGNOSIS — A159 Respiratory tuberculosis unspecified: Secondary | ICD-10-CM

## 2014-01-09 ENCOUNTER — Telehealth: Payer: Self-pay | Admitting: Internal Medicine

## 2014-01-09 NOTE — Telephone Encounter (Signed)
Patient is inquiring on rescheduling coum appt but would like to talk to you about dosage first.

## 2014-01-11 ENCOUNTER — Other Ambulatory Visit: Payer: Self-pay | Admitting: Internal Medicine

## 2014-01-11 NOTE — Telephone Encounter (Signed)
Are you okay with refilling this again? Pls advise, thanks

## 2014-01-12 NOTE — Telephone Encounter (Signed)
Patient stated that he need his dosage changed on medication coumadin. Please advise

## 2014-01-12 NOTE — Telephone Encounter (Signed)
Please advise 

## 2014-01-12 NOTE — Telephone Encounter (Signed)
It does not appear that he is seeing me until January so I would not change his dosage. He should be seen if he is having problems.

## 2014-01-15 ENCOUNTER — Other Ambulatory Visit: Payer: Self-pay

## 2014-01-15 ENCOUNTER — Ambulatory Visit: Admitting: Family

## 2014-01-15 MED ORDER — WARFARIN SODIUM 5 MG PO TABS: ORAL_TABLET | ORAL | Status: DC

## 2014-01-16 NOTE — Telephone Encounter (Signed)
Left message for patient to call me back. 

## 2014-01-17 ENCOUNTER — Ambulatory Visit (INDEPENDENT_AMBULATORY_CARE_PROVIDER_SITE_OTHER): Admitting: *Deleted

## 2014-01-17 DIAGNOSIS — I82409 Acute embolism and thrombosis of unspecified deep veins of unspecified lower extremity: Secondary | ICD-10-CM

## 2014-01-17 DIAGNOSIS — Z5181 Encounter for therapeutic drug level monitoring: Secondary | ICD-10-CM

## 2014-01-17 LAB — POCT INR: INR: 2.1

## 2014-01-31 ENCOUNTER — Ambulatory Visit (INDEPENDENT_AMBULATORY_CARE_PROVIDER_SITE_OTHER): Admitting: *Deleted

## 2014-01-31 DIAGNOSIS — I82409 Acute embolism and thrombosis of unspecified deep veins of unspecified lower extremity: Secondary | ICD-10-CM

## 2014-01-31 DIAGNOSIS — Z5181 Encounter for therapeutic drug level monitoring: Secondary | ICD-10-CM

## 2014-01-31 LAB — POCT INR: INR: 5.7

## 2014-02-14 ENCOUNTER — Telehealth: Payer: Self-pay | Admitting: Family

## 2014-02-14 ENCOUNTER — Ambulatory Visit (INDEPENDENT_AMBULATORY_CARE_PROVIDER_SITE_OTHER)

## 2014-02-14 DIAGNOSIS — Z5181 Encounter for therapeutic drug level monitoring: Secondary | ICD-10-CM

## 2014-02-14 DIAGNOSIS — I82409 Acute embolism and thrombosis of unspecified deep veins of unspecified lower extremity: Secondary | ICD-10-CM

## 2014-02-14 LAB — POCT INR: INR: 3.2

## 2014-02-14 NOTE — Telephone Encounter (Signed)
Left a message for call back.  

## 2014-02-14 NOTE — Telephone Encounter (Signed)
Agree with no changes. Please call patient to notify.

## 2014-02-16 NOTE — Telephone Encounter (Signed)
Left message for patient.  Informed there were no changes to his coumadin and to continue to take as prescribed.

## 2014-02-28 ENCOUNTER — Ambulatory Visit (INDEPENDENT_AMBULATORY_CARE_PROVIDER_SITE_OTHER)

## 2014-02-28 DIAGNOSIS — Z5181 Encounter for therapeutic drug level monitoring: Secondary | ICD-10-CM

## 2014-02-28 DIAGNOSIS — I82409 Acute embolism and thrombosis of unspecified deep veins of unspecified lower extremity: Secondary | ICD-10-CM

## 2014-02-28 LAB — POCT INR: INR: 6.1

## 2014-02-28 NOTE — Progress Notes (Signed)
Per protocol, pt due to go to lab.  Pt refused.  Stating "I'm not going to lab."  Pt was then advised to hold coumadin x 3 days and take 2 tablet every day except Monday take 3 tablets. He was also highly advised to avoid falls or activity that will put him at risk for bleeding and to go to ER if he starts to exhibit any signs of bleeding, headache, or confusion.  Pt stated understanding and agreed.

## 2014-03-07 ENCOUNTER — Ambulatory Visit (INDEPENDENT_AMBULATORY_CARE_PROVIDER_SITE_OTHER)

## 2014-03-07 DIAGNOSIS — Z5181 Encounter for therapeutic drug level monitoring: Secondary | ICD-10-CM

## 2014-03-07 DIAGNOSIS — I82409 Acute embolism and thrombosis of unspecified deep veins of unspecified lower extremity: Secondary | ICD-10-CM

## 2014-03-07 LAB — POCT INR: INR: 2.1

## 2014-03-28 ENCOUNTER — Ambulatory Visit: Admitting: Family Medicine

## 2014-03-28 DIAGNOSIS — I82409 Acute embolism and thrombosis of unspecified deep veins of unspecified lower extremity: Secondary | ICD-10-CM

## 2014-03-28 DIAGNOSIS — Z5181 Encounter for therapeutic drug level monitoring: Secondary | ICD-10-CM

## 2014-03-28 LAB — POCT INR: INR: 7.4

## 2014-04-03 ENCOUNTER — Ambulatory Visit

## 2014-04-13 ENCOUNTER — Ambulatory Visit: Admitting: Internal Medicine

## 2014-04-23 ENCOUNTER — Other Ambulatory Visit: Payer: Self-pay | Admitting: Infectious Diseases

## 2014-04-30 ENCOUNTER — Other Ambulatory Visit: Payer: Self-pay | Admitting: Infectious Diseases

## 2014-06-20 ENCOUNTER — Ambulatory Visit (INDEPENDENT_AMBULATORY_CARE_PROVIDER_SITE_OTHER): Admitting: General Practice

## 2014-06-20 DIAGNOSIS — I82409 Acute embolism and thrombosis of unspecified deep veins of unspecified lower extremity: Secondary | ICD-10-CM

## 2014-06-20 DIAGNOSIS — Z5181 Encounter for therapeutic drug level monitoring: Secondary | ICD-10-CM

## 2014-06-20 LAB — POCT INR: INR: 1.6

## 2014-06-20 NOTE — Progress Notes (Signed)
Agree with plan 

## 2014-06-20 NOTE — Progress Notes (Signed)
Pre visit review using our clinic review tool, if applicable. No additional management support is needed unless otherwise documented below in the visit note. 

## 2014-06-25 ENCOUNTER — Other Ambulatory Visit: Payer: Self-pay | Admitting: Infectious Diseases

## 2014-06-25 DIAGNOSIS — B20 Human immunodeficiency virus [HIV] disease: Secondary | ICD-10-CM

## 2014-06-26 ENCOUNTER — Telehealth: Payer: Self-pay | Admitting: *Deleted

## 2014-06-26 ENCOUNTER — Other Ambulatory Visit: Payer: Self-pay | Admitting: *Deleted

## 2014-06-26 DIAGNOSIS — Z113 Encounter for screening for infections with a predominantly sexual mode of transmission: Secondary | ICD-10-CM

## 2014-06-26 DIAGNOSIS — Z79899 Other long term (current) drug therapy: Secondary | ICD-10-CM

## 2014-06-26 DIAGNOSIS — B20 Human immunodeficiency virus [HIV] disease: Secondary | ICD-10-CM

## 2014-06-26 NOTE — Telephone Encounter (Signed)
Received request for refill of epzicom. Patient last seen 09/2013, does not have follow up scheduled, is 3 months overdue for an appointment. RN authorized 1 month refill. Left message on patient's voicemail asking him to call and schedule lab/MD visit. Andree CossHowell, Zekiah Coen M, RN

## 2014-07-04 ENCOUNTER — Ambulatory Visit

## 2014-07-06 ENCOUNTER — Other Ambulatory Visit

## 2014-07-06 DIAGNOSIS — Z79899 Other long term (current) drug therapy: Secondary | ICD-10-CM

## 2014-07-06 DIAGNOSIS — B20 Human immunodeficiency virus [HIV] disease: Secondary | ICD-10-CM

## 2014-07-06 DIAGNOSIS — Z113 Encounter for screening for infections with a predominantly sexual mode of transmission: Secondary | ICD-10-CM

## 2014-07-06 LAB — COMPLETE METABOLIC PANEL WITH GFR
ALBUMIN: 3.7 g/dL (ref 3.5–5.2)
ALT: 18 U/L (ref 0–53)
AST: 19 U/L (ref 0–37)
Alkaline Phosphatase: 103 U/L (ref 39–117)
BILIRUBIN TOTAL: 0.5 mg/dL (ref 0.2–1.2)
BUN: 16 mg/dL (ref 6–23)
CO2: 23 mEq/L (ref 19–32)
Calcium: 8.8 mg/dL (ref 8.4–10.5)
Chloride: 107 mEq/L (ref 96–112)
Creat: 1.23 mg/dL (ref 0.50–1.35)
GFR, EST NON AFRICAN AMERICAN: 65 mL/min
GFR, Est African American: 75 mL/min
GLUCOSE: 90 mg/dL (ref 70–99)
POTASSIUM: 4.5 meq/L (ref 3.5–5.3)
Sodium: 140 mEq/L (ref 135–145)
Total Protein: 7.6 g/dL (ref 6.0–8.3)

## 2014-07-06 LAB — CBC WITH DIFFERENTIAL/PLATELET
BASOS ABS: 0.1 10*3/uL (ref 0.0–0.1)
BASOS PCT: 1 % (ref 0–1)
EOS PCT: 6 % — AB (ref 0–5)
Eosinophils Absolute: 0.4 10*3/uL (ref 0.0–0.7)
HEMATOCRIT: 44.4 % (ref 39.0–52.0)
Hemoglobin: 14.9 g/dL (ref 13.0–17.0)
Lymphocytes Relative: 22 % (ref 12–46)
Lymphs Abs: 1.3 10*3/uL (ref 0.7–4.0)
MCH: 29.4 pg (ref 26.0–34.0)
MCHC: 33.6 g/dL (ref 30.0–36.0)
MCV: 87.7 fL (ref 78.0–100.0)
MONO ABS: 0.6 10*3/uL (ref 0.1–1.0)
MPV: 9.7 fL (ref 8.6–12.4)
Monocytes Relative: 10 % (ref 3–12)
Neutro Abs: 3.7 10*3/uL (ref 1.7–7.7)
Neutrophils Relative %: 61 % (ref 43–77)
Platelets: 201 10*3/uL (ref 150–400)
RBC: 5.06 MIL/uL (ref 4.22–5.81)
RDW: 15.5 % (ref 11.5–15.5)
WBC: 6 10*3/uL (ref 4.0–10.5)

## 2014-07-06 LAB — LIPID PANEL
CHOLESTEROL: 174 mg/dL (ref 0–200)
HDL: 35 mg/dL — AB (ref >=40)
LDL Cholesterol: 101 mg/dL — ABNORMAL HIGH (ref 0–99)
Total CHOL/HDL Ratio: 5 Ratio
Triglycerides: 189 mg/dL — ABNORMAL HIGH (ref <150)
VLDL: 38 mg/dL (ref 0–40)

## 2014-07-06 LAB — T-HELPER CELL (CD4) - (RCID CLINIC ONLY)
CD4 T CELL HELPER: 8 % — AB (ref 33–55)
CD4 T Cell Abs: 100 /uL — ABNORMAL LOW (ref 400–2700)

## 2014-07-07 LAB — RPR

## 2014-07-09 LAB — HIV-1 RNA QUANT-NO REFLEX-BLD

## 2014-07-11 ENCOUNTER — Ambulatory Visit (INDEPENDENT_AMBULATORY_CARE_PROVIDER_SITE_OTHER): Admitting: Infectious Diseases

## 2014-07-11 ENCOUNTER — Encounter: Payer: Self-pay | Admitting: Infectious Diseases

## 2014-07-11 VITALS — BP 122/74 | HR 57 | Temp 98.2°F | Ht 77.0 in | Wt 304.0 lb

## 2014-07-11 DIAGNOSIS — Z21 Asymptomatic human immunodeficiency virus [HIV] infection status: Secondary | ICD-10-CM | POA: Diagnosis not present

## 2014-07-11 DIAGNOSIS — Z23 Encounter for immunization: Secondary | ICD-10-CM

## 2014-07-11 DIAGNOSIS — Z0000 Encounter for general adult medical examination without abnormal findings: Secondary | ICD-10-CM

## 2014-07-11 DIAGNOSIS — B20 Human immunodeficiency virus [HIV] disease: Secondary | ICD-10-CM

## 2014-07-11 DIAGNOSIS — I82409 Acute embolism and thrombosis of unspecified deep veins of unspecified lower extremity: Secondary | ICD-10-CM | POA: Diagnosis not present

## 2014-07-11 DIAGNOSIS — Z933 Colostomy status: Secondary | ICD-10-CM | POA: Diagnosis not present

## 2014-07-11 DIAGNOSIS — A150 Tuberculosis of lung: Secondary | ICD-10-CM | POA: Diagnosis not present

## 2014-07-11 NOTE — Assessment & Plan Note (Signed)
Will have him seen by coumadin clinic, he needs to determine length of therapy.

## 2014-07-11 NOTE — Assessment & Plan Note (Signed)
I am concerned about giving him bactrim for his CD4 < 200 while he is on coumadin.  Will hold on this for now.  Needs Hep B #3 today.  He is doing well, his Cr is stable, his CD4 has improved.  Will get him into dental.  Will see him back in 6 months.

## 2014-07-11 NOTE — Assessment & Plan Note (Signed)
Will have him seen by GI, he is interested in colonoscopy

## 2014-07-11 NOTE — Assessment & Plan Note (Signed)
Has completed therapy.  Will continue to follow clinically.
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
## 2014-07-11 NOTE — Addendum Note (Signed)
Addended by: Andree CossHOWELL, Juanda Luba M on: 07/11/2014 04:57 PM   Modules accepted: Orders

## 2014-07-16 ENCOUNTER — Encounter: Payer: Self-pay | Admitting: Physician Assistant

## 2014-07-25 ENCOUNTER — Other Ambulatory Visit: Payer: Self-pay | Admitting: Infectious Diseases

## 2014-07-31 ENCOUNTER — Encounter: Payer: Self-pay | Admitting: Physician Assistant

## 2014-07-31 ENCOUNTER — Ambulatory Visit (INDEPENDENT_AMBULATORY_CARE_PROVIDER_SITE_OTHER): Admitting: General Practice

## 2014-07-31 ENCOUNTER — Ambulatory Visit (INDEPENDENT_AMBULATORY_CARE_PROVIDER_SITE_OTHER): Admitting: Physician Assistant

## 2014-07-31 VITALS — BP 120/70 | HR 76 | Ht 77.0 in | Wt 309.8 lb

## 2014-07-31 DIAGNOSIS — Z933 Colostomy status: Secondary | ICD-10-CM | POA: Diagnosis not present

## 2014-07-31 DIAGNOSIS — Z7901 Long term (current) use of anticoagulants: Secondary | ICD-10-CM | POA: Diagnosis not present

## 2014-07-31 DIAGNOSIS — Z1211 Encounter for screening for malignant neoplasm of colon: Secondary | ICD-10-CM | POA: Diagnosis not present

## 2014-07-31 DIAGNOSIS — Z5181 Encounter for therapeutic drug level monitoring: Secondary | ICD-10-CM

## 2014-07-31 LAB — POCT INR: INR: 3

## 2014-07-31 NOTE — Progress Notes (Signed)
Reviewed and agree with management plan.  Malcolm T. Stark, MD FACG 

## 2014-07-31 NOTE — Patient Instructions (Signed)
We have sent your demographic and insurance information to [facility]. They should contact you within the next week regarding your Cologuard (colon cancer screening) test. If you have not heard from them within the next week, please call our office at [phone]. 
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
## 2014-07-31 NOTE — Progress Notes (Signed)
Agree with plan 

## 2014-07-31 NOTE — Progress Notes (Signed)
Pre visit review using our clinic review tool, if applicable. No additional management support is needed unless otherwise documented below in the visit note. 

## 2014-08-07 ENCOUNTER — Telehealth: Payer: Self-pay | Admitting: *Deleted

## 2014-08-07 NOTE — Telephone Encounter (Signed)
I called to ask a representative that I tried to do an order on the Color[employer] portal and had trouble.  I put in [employer] but there were numerous options.  I told the rep I will fill out the form and do the order this way,. I advised I have the [employer] number for the patient. I do not have a copy of the card. I called the patient twice and left a message to bring us the card to copy but he has not called back.  He did not have the card when he was here on 07-31-2014.

## 2014-08-28 ENCOUNTER — Ambulatory Visit (INDEPENDENT_AMBULATORY_CARE_PROVIDER_SITE_OTHER): Admitting: General Practice

## 2014-08-28 DIAGNOSIS — Z5181 Encounter for therapeutic drug level monitoring: Secondary | ICD-10-CM | POA: Diagnosis not present

## 2014-08-28 DIAGNOSIS — I82409 Acute embolism and thrombosis of unspecified deep veins of unspecified lower extremity: Secondary | ICD-10-CM

## 2014-08-28 NOTE — Progress Notes (Signed)
Pre visit review using our clinic review tool, if applicable. No additional management support is needed unless otherwise documented below in the visit note. 

## 2014-08-28 NOTE — Progress Notes (Signed)
Agree with plan 

## 2014-09-07 ENCOUNTER — Other Ambulatory Visit: Payer: Self-pay | Admitting: Infectious Diseases

## 2014-09-07 DIAGNOSIS — B20 Human immunodeficiency virus [HIV] disease: Secondary | ICD-10-CM

## 2014-09-25 ENCOUNTER — Ambulatory Visit

## 2014-10-03 ENCOUNTER — Ambulatory Visit (INDEPENDENT_AMBULATORY_CARE_PROVIDER_SITE_OTHER): Admitting: General Practice

## 2014-10-03 ENCOUNTER — Ambulatory Visit (INDEPENDENT_AMBULATORY_CARE_PROVIDER_SITE_OTHER): Admitting: Internal Medicine

## 2014-10-03 ENCOUNTER — Other Ambulatory Visit: Payer: Self-pay | Admitting: General Practice

## 2014-10-03 ENCOUNTER — Encounter: Payer: Self-pay | Admitting: Internal Medicine

## 2014-10-03 VITALS — BP 126/70 | HR 58 | Temp 97.8°F | Resp 20 | Ht 77.0 in | Wt 318.0 lb

## 2014-10-03 DIAGNOSIS — N183 Chronic kidney disease, stage 3 unspecified: Secondary | ICD-10-CM

## 2014-10-03 DIAGNOSIS — E669 Obesity, unspecified: Secondary | ICD-10-CM | POA: Diagnosis not present

## 2014-10-03 DIAGNOSIS — I82409 Acute embolism and thrombosis of unspecified deep veins of unspecified lower extremity: Secondary | ICD-10-CM | POA: Diagnosis not present

## 2014-10-03 DIAGNOSIS — Z23 Encounter for immunization: Secondary | ICD-10-CM | POA: Diagnosis not present

## 2014-10-03 DIAGNOSIS — Z5181 Encounter for therapeutic drug level monitoring: Secondary | ICD-10-CM

## 2014-10-03 LAB — POCT INR: INR: 1.5

## 2014-10-03 MED ORDER — WARFARIN SODIUM 5 MG PO TABS: ORAL_TABLET | ORAL | Status: AC

## 2014-10-03 NOTE — Assessment & Plan Note (Signed)
Would classify as provoked DVT (car ride from [place] and uncontrolled pulmonary TB and HIV). Has undergone more than 1 year of treatment. Will stop coumadin. Slight risk for recurrence with smoking and has talked to him about quitting. He will think about it but does not feel able to quit at this time. No family history of blood clot and no personal history of blood clot.

## 2014-10-03 NOTE — Patient Instructions (Signed)
You do not need to take the coumadin (warfarin) anymore.   You can let the pharmacy know that you do not need it.   Come back in about 1 year and we can check in on you.   I will send a note to Dr. Ninetta LightsHatcher letting him know that you still need to see the dentist and that you are coming off the coumadin.  Work on getting back to exercising some and working on getting rid of some of the extra pounds since last year.   Serving Sizes What we call a serving size today is larger than it was in the past. A 1950s fast-food burger contained little more than 1 oz of meat, and a soft drink was 8 oz (1 cup). Today, a "quarter pounder" burger is at least 4 times that amount, and a 32 or 64 oz drink is not uncommon. A possible guide for eating when trying to lose weight is to eat about half as much as you normally do. Some estimates of serving sizes are:  1 Dairy serving:Individual container of yogurt (8 oz) or piece of cheese the size of your thumb (1 oz).  1 Grain serving: 1 slice of bread or  cup pasta.  1 Meat serving: The size of a deck of cards (3 oz).  1 Fruit serving: cup canned fruit or 1 medium fruit.  1 Vegetable serving:  cup of cooked or canned vegetables.  1 Fat serving:The size of 4 stacked dimes. Experts suggest spending 1 or 2 days measuring food portions you commonly eat. This will give you better practice at estimating serving sizes, and will also show whether you are eating an appropriate amount of food to meet your weight goals. If you find that you are eating more than you thought, try measuring your food for a few days so you can "reprogram" yourself to learn what makes a healthy portion for you. SUGGESTIONS FOR CONTROL  In restaurants, share entrees, or ask the waiter to put half the entre in a box or bag before you even touch it.  Order lunch-sized portions. Many restaurants serve 4 to 6 oz of meat at lunch, compared with 8 to 10 oz at dinner.  Split dessert or skip it  all together. Have a piece of fruit when you get home.  At home, use smaller plates and bowls. It will look as if you are eating more.  Plate your food in the kitchen rather than serving it "family style" at the table.  Wait 20 to 30 minutes before taking seconds. This is how long it takes your brain to recognize that you are full.  Check food labels for serving sizes. Eat 1 serving only.  Use measuring cups and spoons to see proper serving sizes.  Buy smaller packages of candy, popcorn, and snacks.  Avoid eating directly out of the bag or carton.  While eating half as much, exercise twice as much. Park further away from the mall, take the stairs instead of the escalator, and walk around your block. Losing weight is a slow, difficult process. It takes long-lasting lifestyle changes. You can make gradual changes over time so they become habits. Look to friends and family to support the healthy changes you are making. Avoid fad diets since they are often only temporary weight loss solutions. Document Released: 12/20/2002 Document Revised: 06/15/2011 Document Reviewed: 06/20/2013 [facility] Patient Information 2015 [employer] [place]. This information is not intended to replace advice given to you by your health care  provider. Make sure you discuss any questions you have with your health care provider.
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
## 2014-10-03 NOTE — Assessment & Plan Note (Signed)
No known hypertension and BP okay today. Will ask ID doctor to check HgA1c at next blood draw since he is overweight and has had some borderline sugars.

## 2014-10-03 NOTE — Progress Notes (Signed)
Pre visit review using our clinic review tool, if applicable. No additional management support is needed unless otherwise documented below in the visit note. 

## 2014-10-03 NOTE — Assessment & Plan Note (Signed)
Cholesterol is okay, BP okay. Checking HgA1c at next lab draw. Talked to him about the fact that he may have to change his diet and work on exercising more again. Previously his infections that were untreated were likely consuming calories and he was not gaining weight. No clinical signs of OSA. His HIV will increase his risk of heart disease and risk factor modification will be important.

## 2014-10-08 NOTE — Progress Notes (Signed)
I have reviewed and agree with the plan. 

## 2014-10-31 ENCOUNTER — Ambulatory Visit

## 2015-01-17 ENCOUNTER — Other Ambulatory Visit: Payer: Self-pay | Admitting: Internal Medicine

## 2015-04-02 ENCOUNTER — Telehealth: Payer: Self-pay | Admitting: *Deleted

## 2015-04-02 ENCOUNTER — Other Ambulatory Visit: Payer: Self-pay | Admitting: Infectious Diseases

## 2015-04-02 NOTE — Telephone Encounter (Signed)
Patient was prescribed Tivicay twice daily and has been taking it this way since June. Is this the correct regimen? David Fry

## 2015-04-03 ENCOUNTER — Other Ambulatory Visit: Payer: Self-pay | Admitting: *Deleted

## 2015-04-03 MED ORDER — DOLUTEGRAVIR SODIUM 50 MG PO TABS: ORAL_TABLET | ORAL | Status: DC

## 2015-04-03 NOTE — Telephone Encounter (Signed)
No, it should be once a day now with Epzicom.  It was previously twice a day when he was on Tb treatment but he completed that.  Thanks

## 2015-04-03 NOTE — Telephone Encounter (Signed)
Ok thanks,  I will let the pharmacy know.

## 2015-05-04 ENCOUNTER — Other Ambulatory Visit: Payer: Self-pay | Admitting: Infectious Diseases

## 2015-05-04 DIAGNOSIS — B20 Human immunodeficiency virus [HIV] disease: Secondary | ICD-10-CM

## 2015-08-18 IMAGING — CT CT CHEST W/O CM
2 of 4 series · 15 of 36 positions shown, 18 images · non-contrast
Comparison: 06/26/2013

CLINICAL DATA: shortness of breath.  Abnormal chest x-ray.

EXAM:
CT CHEST WITHOUT CONTRAST
TECHNIQUE: Multidetector CT imaging of the chest was performed following the
standard protocol without IV contrast.

[Series 2: chest without · axial · non-contrast · 0.91mm/px · z∈[-30,+325]mm · 12 of 83 slices shown, 15 images]
[im 6/83  mediastinal]
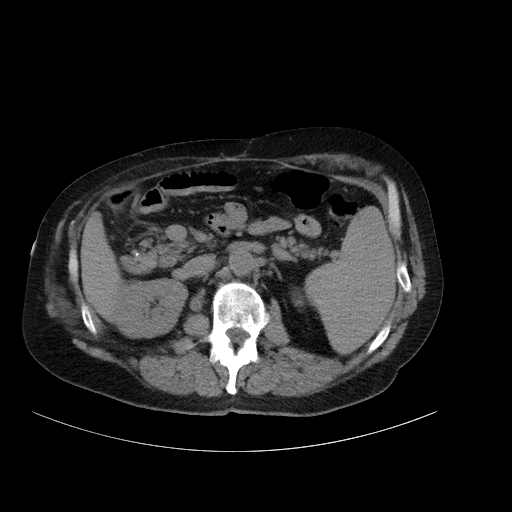
[im 6/83  lung]
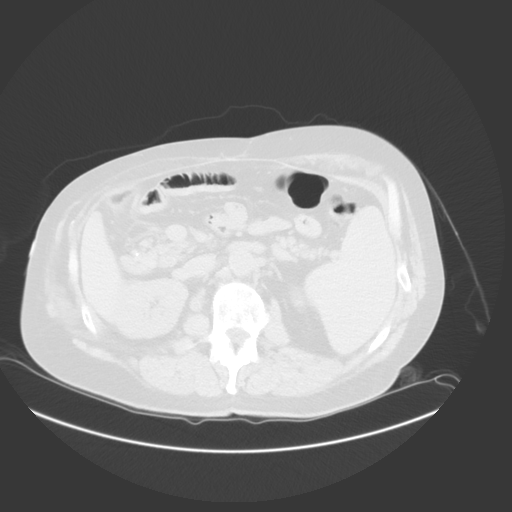
[im 12/83  lung]
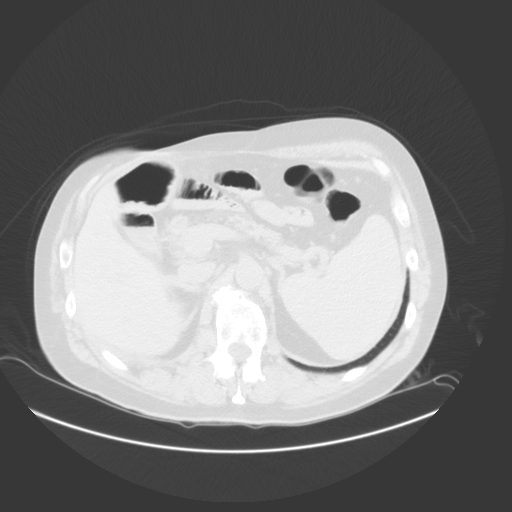
[im 18/83  lung]
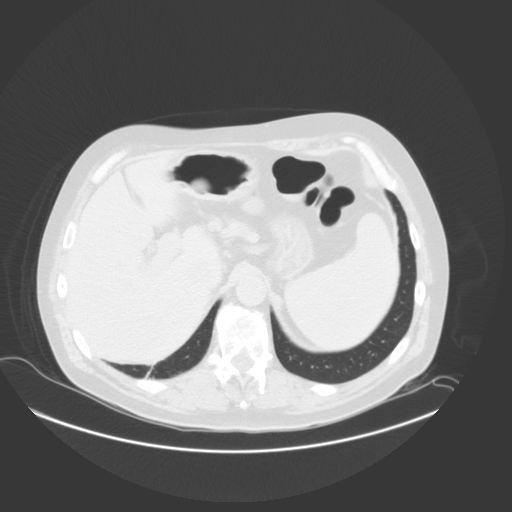
[im 24/83  lung]
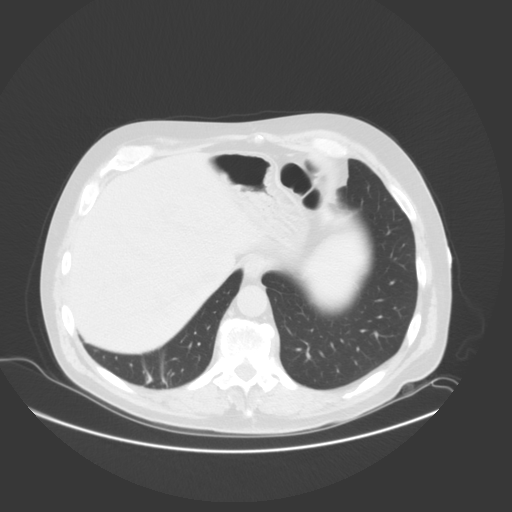
[im 30/83  mediastinal]
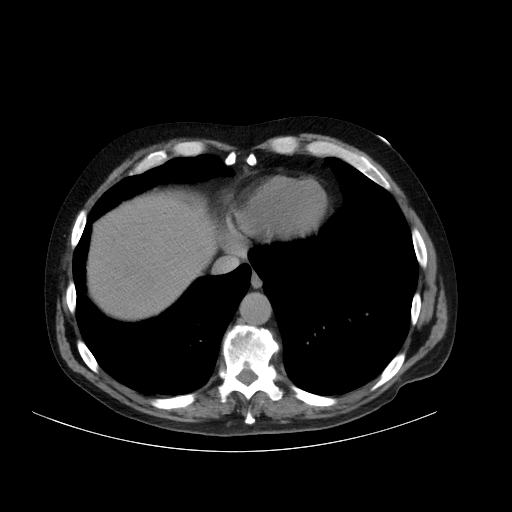
[im 30/83  lung]
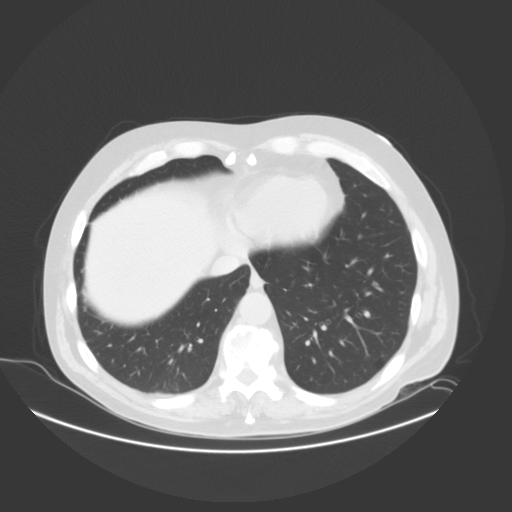
[im 36/83  lung]
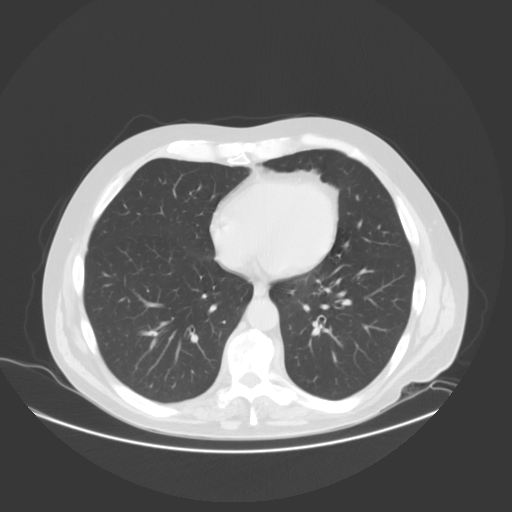
[im 47/83  lung]
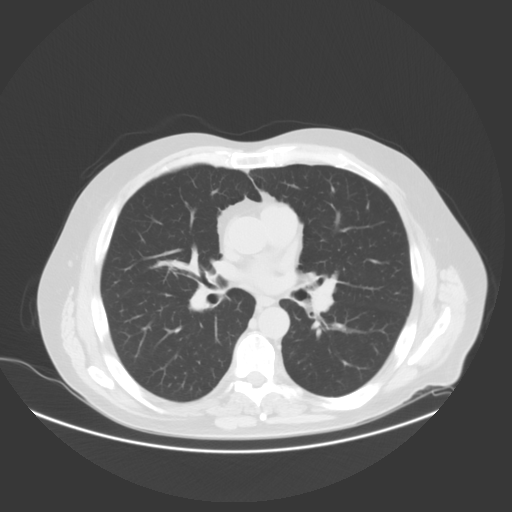
[im 53/83  lung]
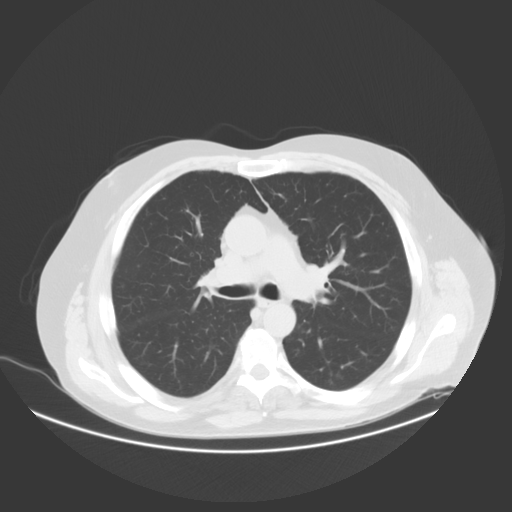
[im 59/83  mediastinal]
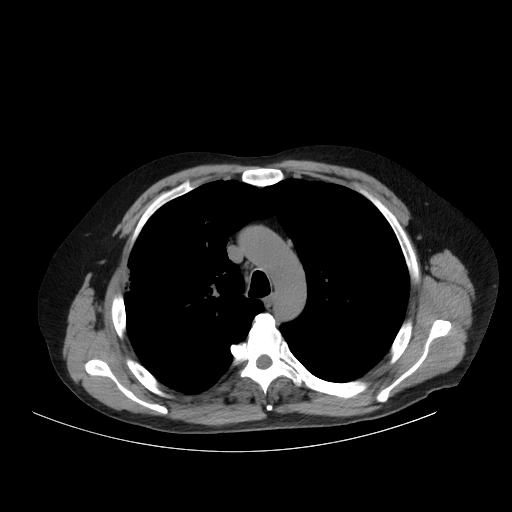
[im 59/83  lung]
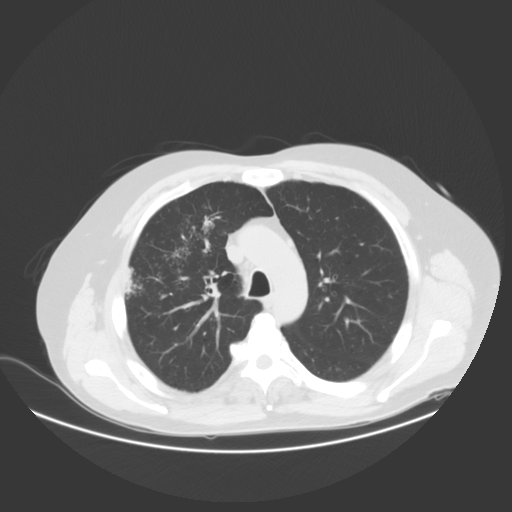
[im 65/83  lung]
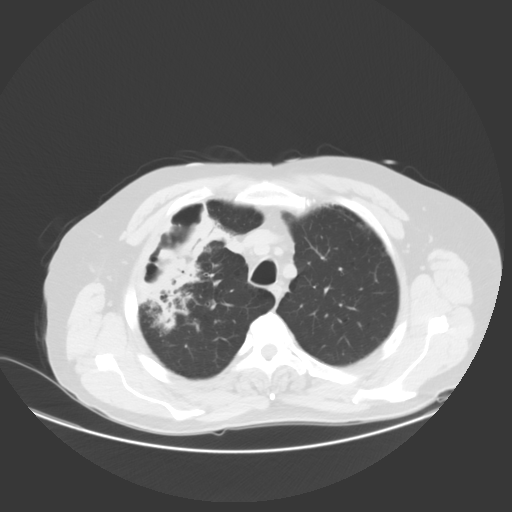
[im 71/83  lung]
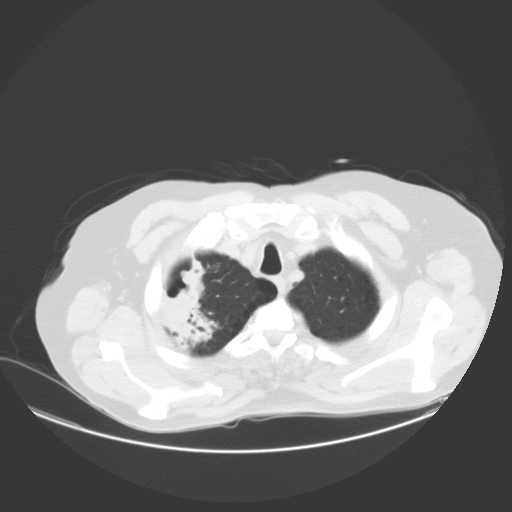
[im 77/83  lung]
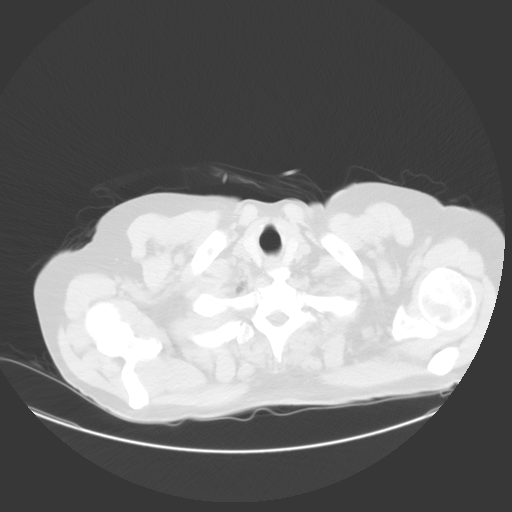

[mpr, coronal, coronal · coronal · 0.91mm/px · 3 of 113 slices shown]
[im 23/113  lung]
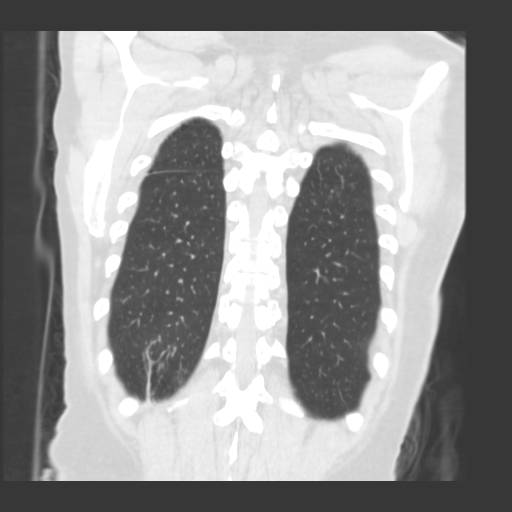
[im 45/113  lung]
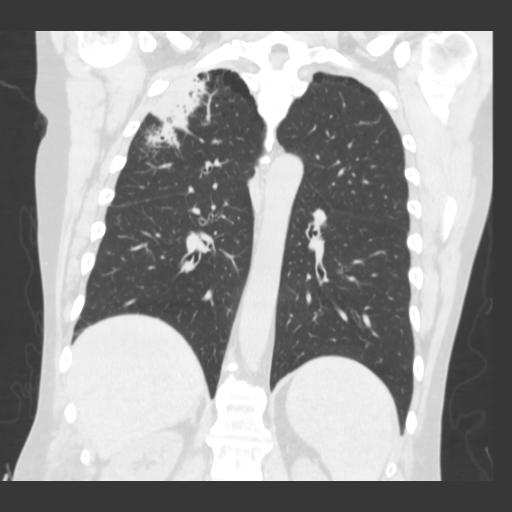
[im 68/113  lung]
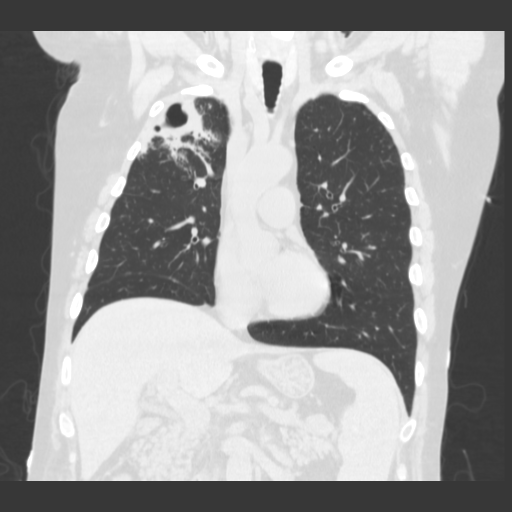

[15 of 36 positions shown; findings below may reference images not displayed]

FINDINGS: Abnormal area of airspace consolidation with associated large area
of cavitation noted in the right upper lobe. The cavitary area
measures approximately 9.5 x 4.9 cm on image 16. Extensive
surrounding consolidation/airspace disease. Differential
considerations include cavitary neoplasm or area of a
infection/pneumonia with abscess formation. Tuberculosis is a
possibility.

No other areas of consolidation. No pleural effusions. No
mediastinal visible hilar or axillary adenopathy. No supraclavicular
adenopathy.

Chest wall soft tissues are unremarkable. Imaging into the upper
abdomen shows no acute findings.
IMPRESSION: Large cavitary masslike area in the right upper lobe measuring up to
9.5 cm with surrounding extensive airspace consolidation.
Differential considerations would include cavitary neoplasm or
pneumonia. Tuberculosis is not excluded. Recommend clinical
correlation. No associated effusion or adenopathy.

## 2015-08-18 IMAGING — CR DG CHEST 2V
2 series · 2 of 2 positions shown · non-contrast
Comparison: No comparisons

CLINICAL DATA: Weakness and dyspnea

EXAM:
CHEST  2 VIEW

[w chest pa]
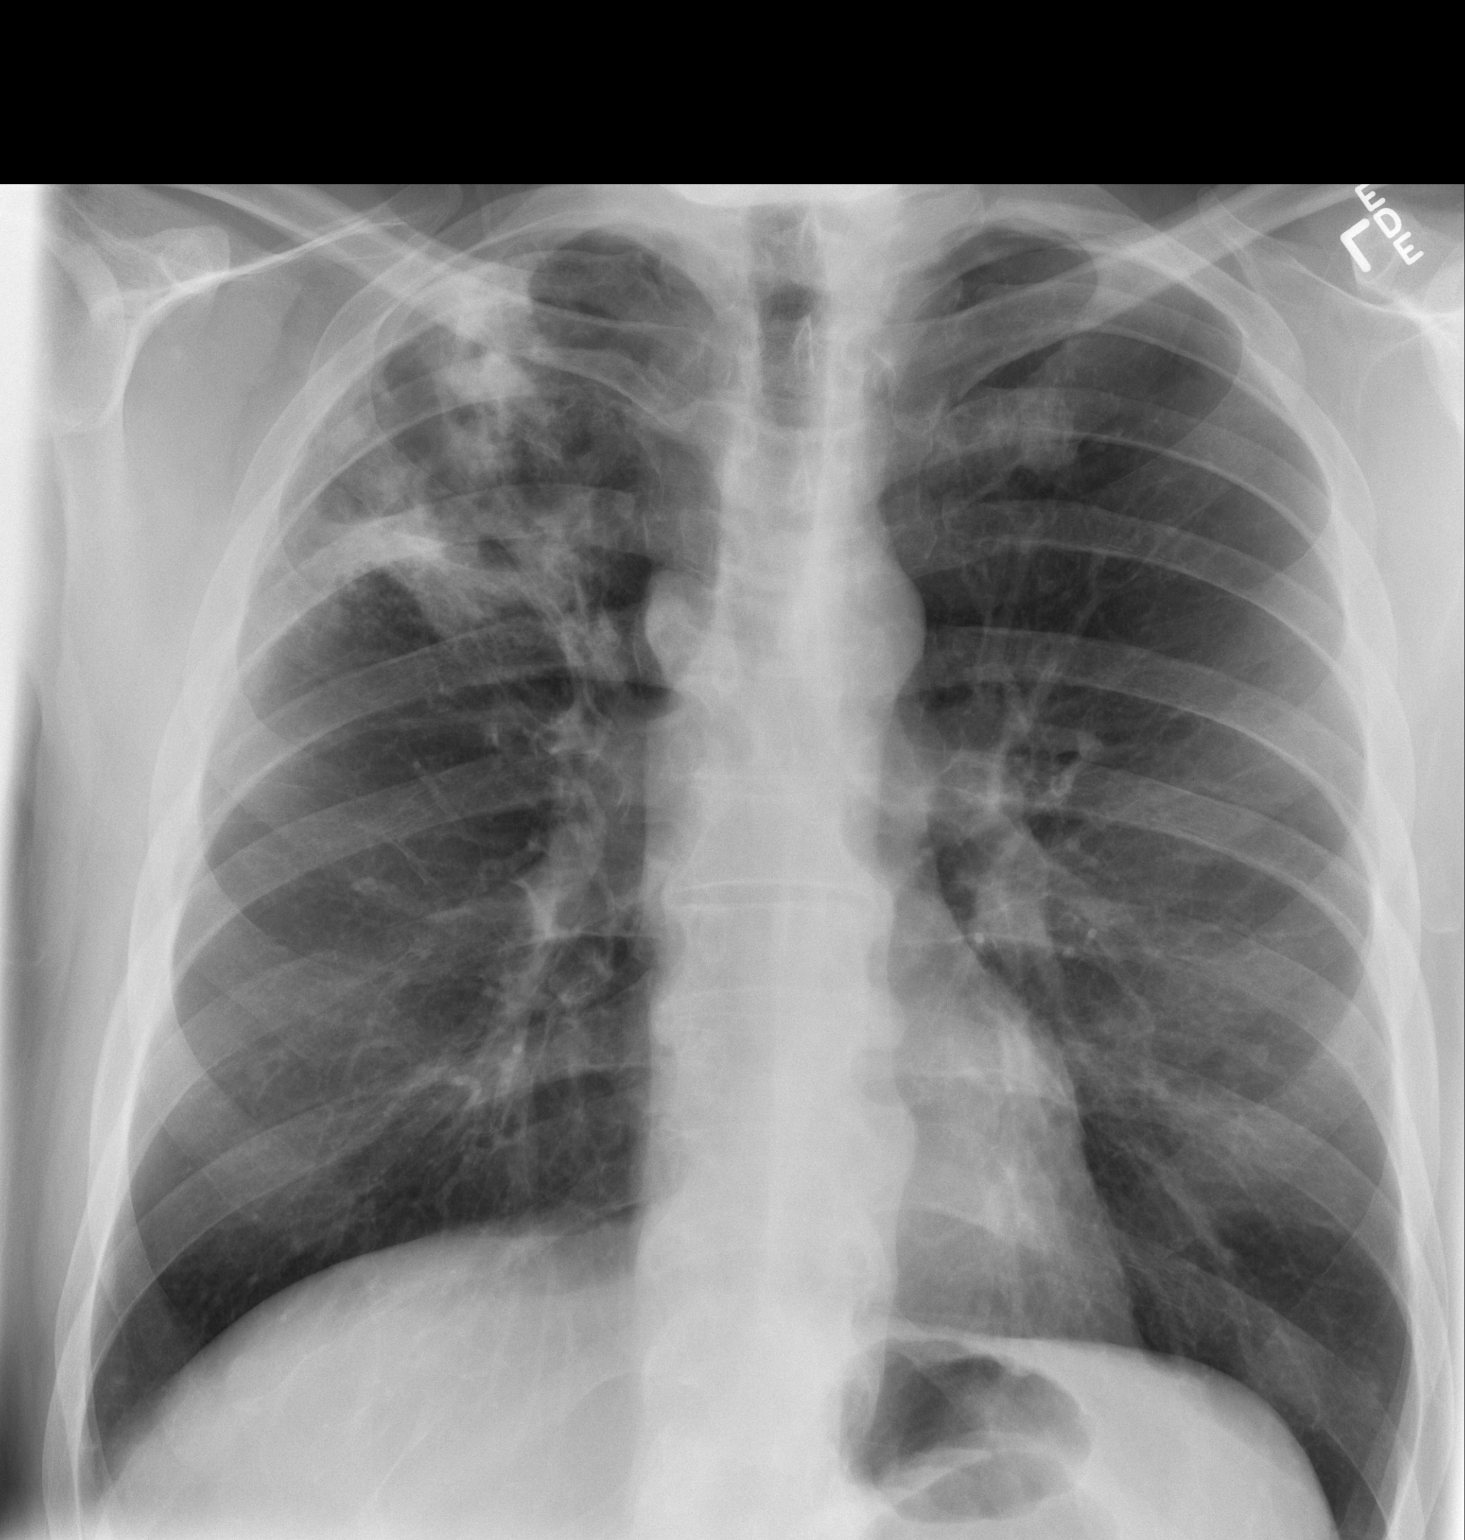

[w chest lat]
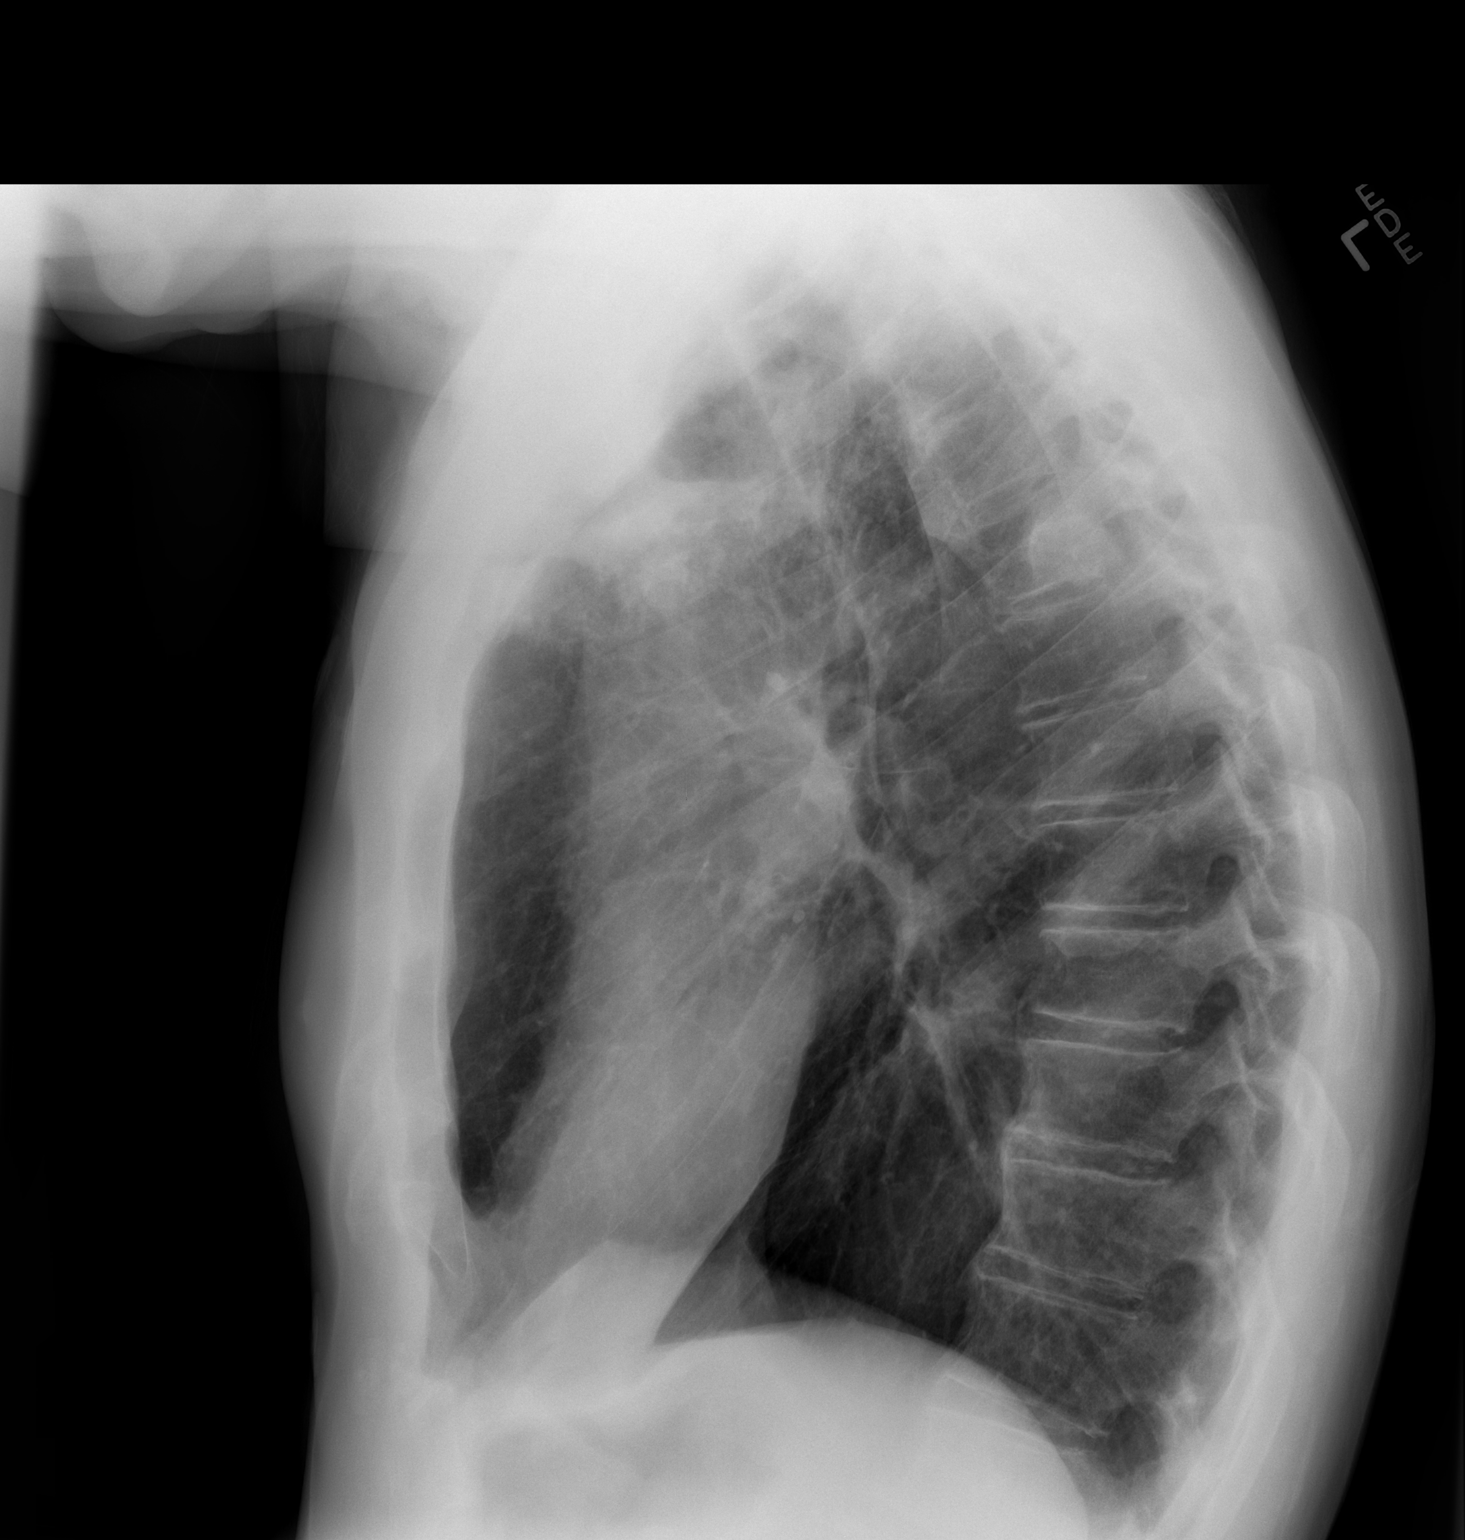

[2 of 2 positions shown; findings below may reference images not displayed]

FINDINGS: The lungs are well-expanded. There are abnormal parenchymal
densities in the right upper lobe. There is an air-fluid level
within 1 of these densities. There is subtle density that projects
in the region of the left first costosternal junction but this may
be due to costochondral junction prominence. The cardiac silhouette
is normal in size. The pulmonary vascularity is not engorged. The
mediastinum is normal in width. There is no pleural effusion though
the tips of the costophrenic angles are not completely included in
the field of view.
IMPRESSION: 1. Patchy cavitary infiltrates in the right upper lobe are
demonstrated. These findings could be infectious or neoplastic.
Tuberculosis is in the differential. There may be minimal density in
the left apex though this is not a definite finding. Further
evaluation with chest CT scanning now is recommended.
2. There is borderline hyperinflation which may reflect an element
of COPD. There is no evidence of CHF. No definite pleural effusion
is demonstrated.
3. These results were called by telephone at the time of
interpretation on 06/26/2013 at [DATE] to SAVIO LOCKLEAR, PA , who
verbally acknowledged these results.

## 2015-08-19 IMAGING — CR DG CHEST 1V PORT
2 series · 2 of 2 positions shown · non-contrast
Comparison: Chest x-ray 06/26/2013.

CLINICAL DATA: Shortness of breath and cough.

EXAM:
PORTABLE CHEST - 1 VIEW

[AP (1 of 2)]
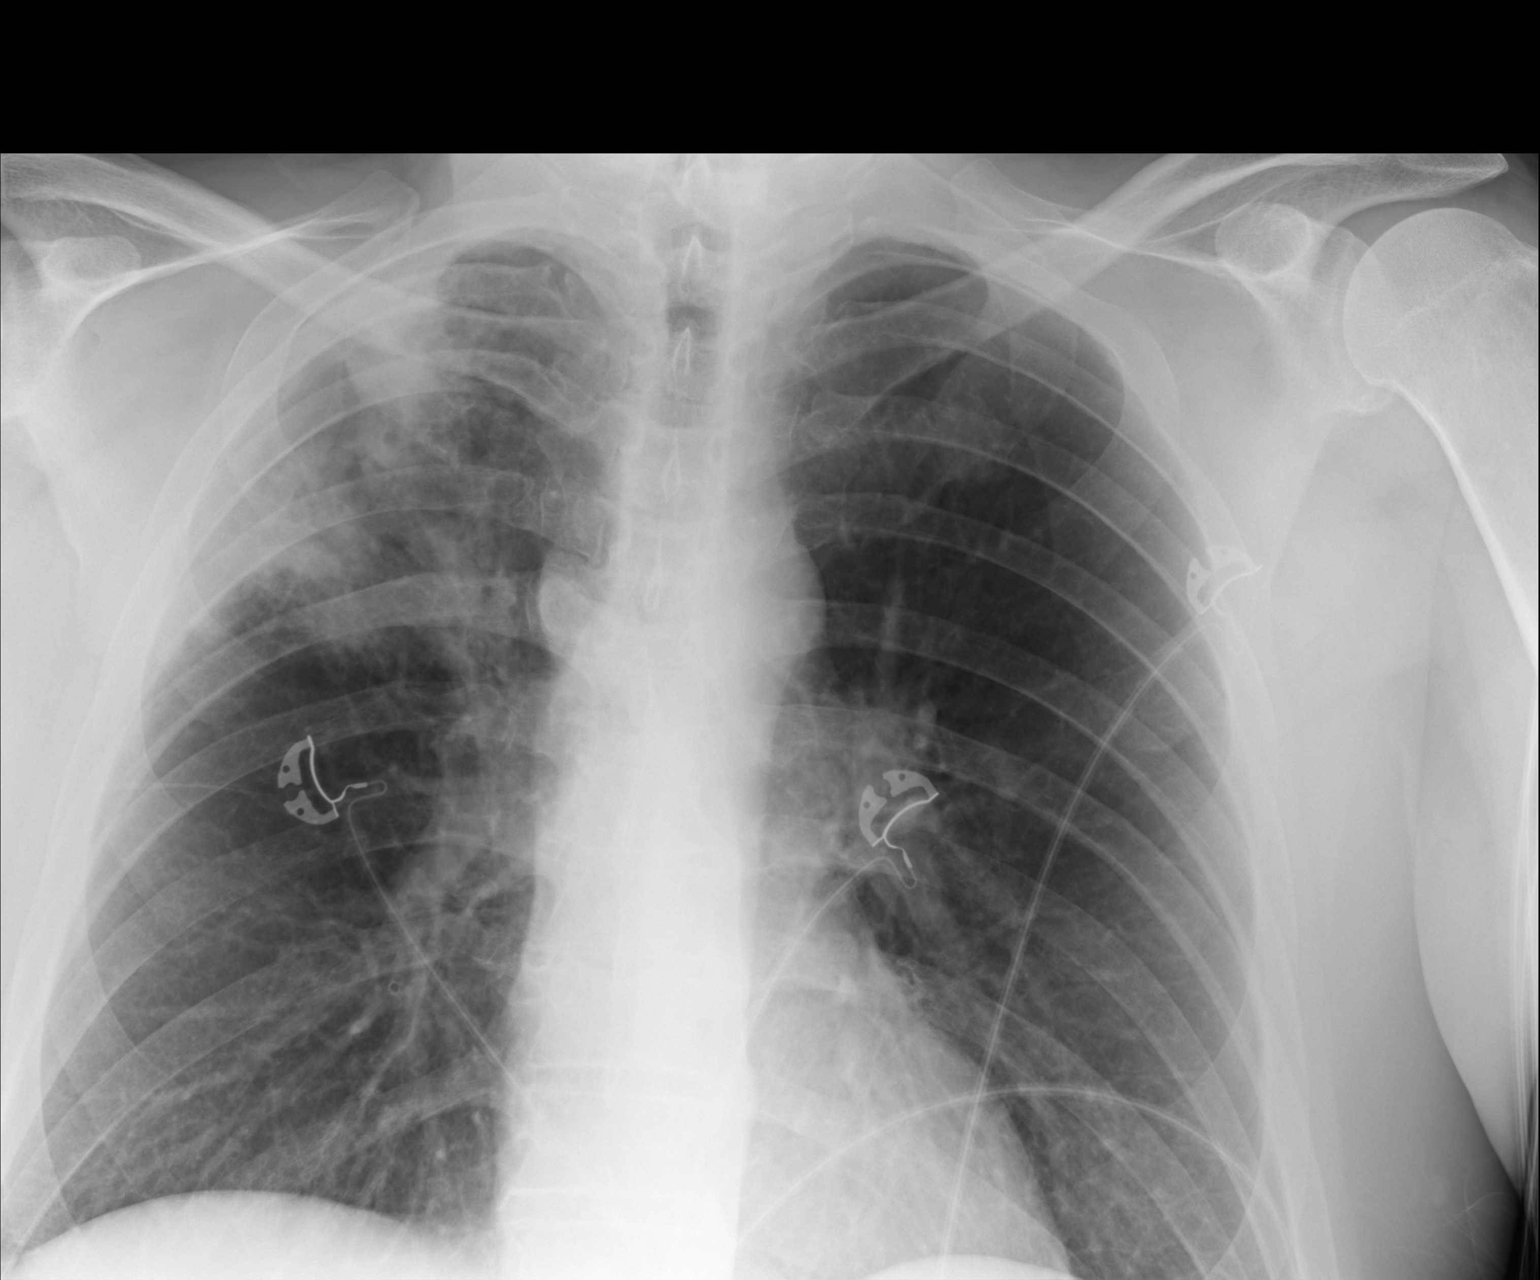

[AP (2 of 2)]
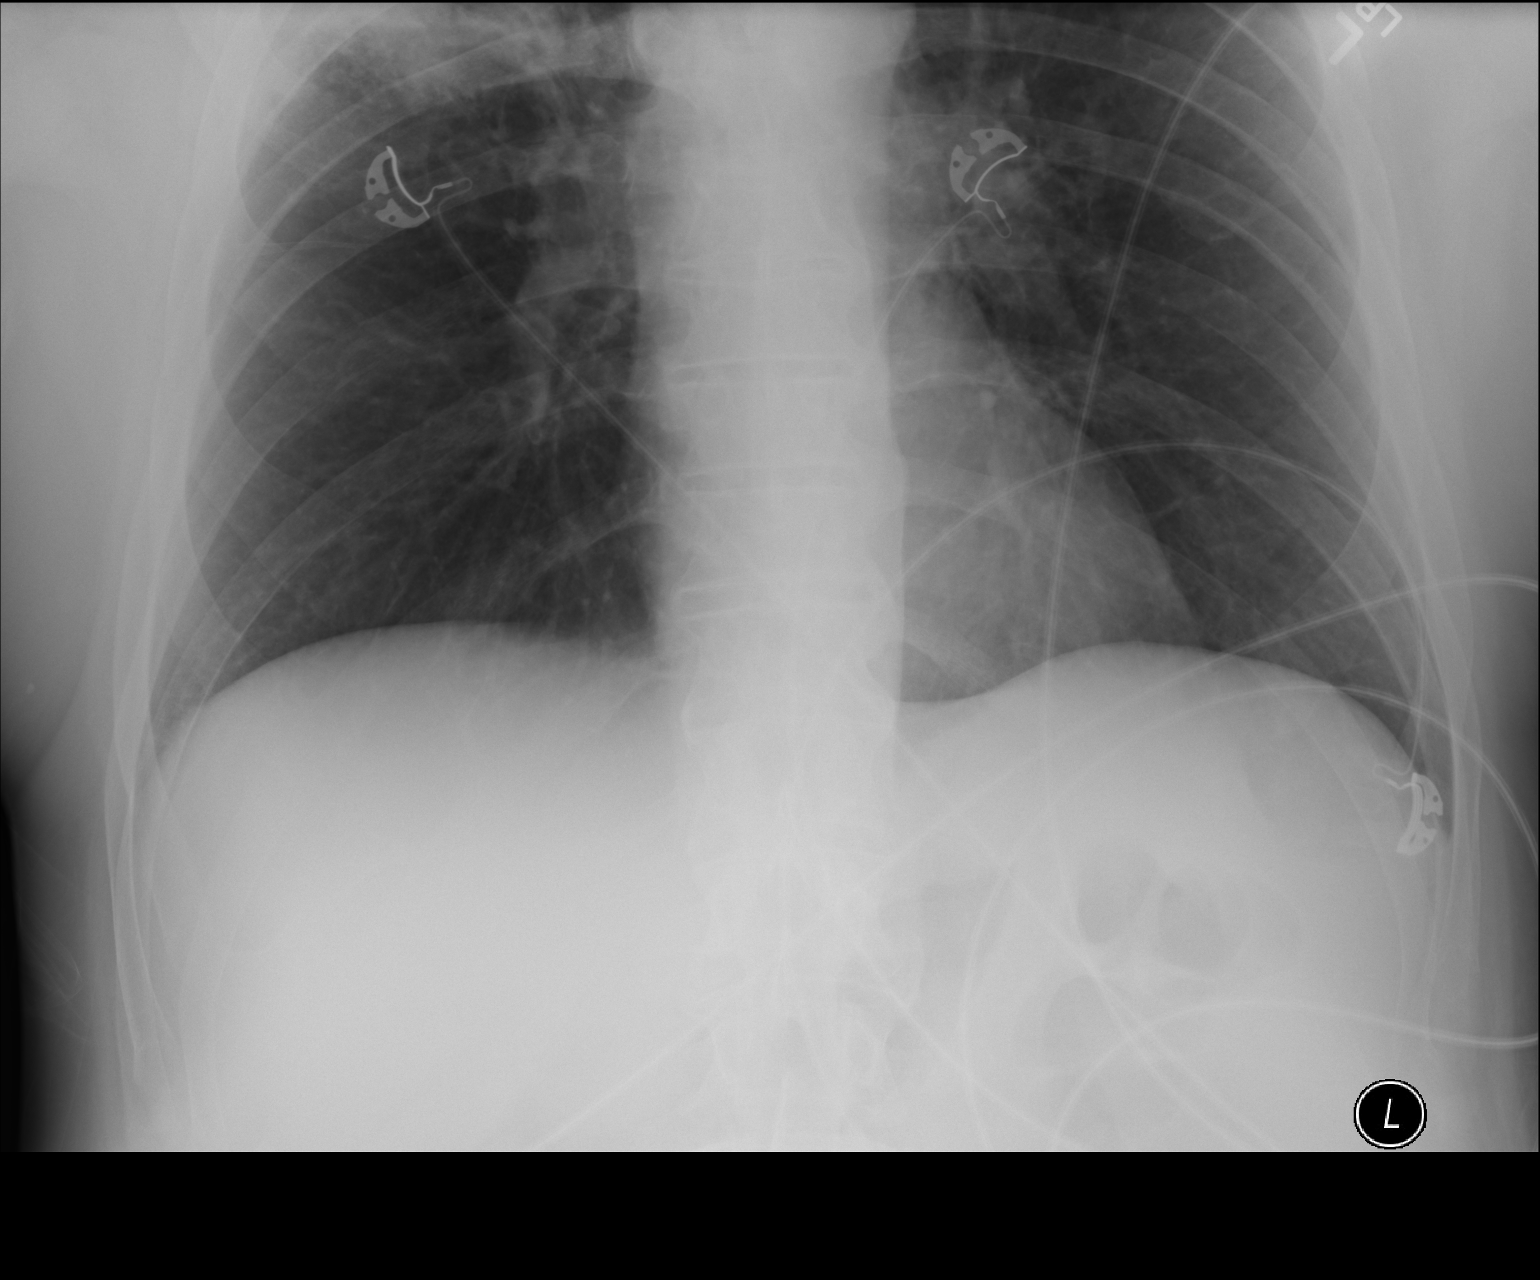

[2 of 2 positions shown; findings below may reference images not displayed]

FINDINGS: There continues to be an area of airspace consolidation and
cavitation in the right upper lobe, similar to prior chest
radiograph 06/26/2013, although the extent of airspace consolidation
appears slightly increased. Lungs are otherwise clear. No pleural
effusions. No evidence of pulmonary edema. Heart size and
mediastinal contours are unremarkable.
IMPRESSION: 1. Large area of airspace consolidation and cavitation in the right
upper lobe redemonstrated. Findings remain highly concerning for
atypical infection, including mycobacterium tuberculosis infection,
as previously described. Clinical correlation and consideration for
respiratory isolation and sputum sampling is recommended.

## 2015-10-04 ENCOUNTER — Encounter: Admitting: Internal Medicine

## 2015-10-04 DIAGNOSIS — Z0289 Encounter for other administrative examinations: Secondary | ICD-10-CM

## 2015-11-22 ENCOUNTER — Other Ambulatory Visit: Payer: Self-pay | Admitting: Infectious Diseases

## 2015-11-22 DIAGNOSIS — B20 Human immunodeficiency virus [HIV] disease: Secondary | ICD-10-CM

## 2015-12-16 ENCOUNTER — Other Ambulatory Visit

## 2015-12-16 DIAGNOSIS — B20 Human immunodeficiency virus [HIV] disease: Secondary | ICD-10-CM

## 2015-12-16 DIAGNOSIS — Z113 Encounter for screening for infections with a predominantly sexual mode of transmission: Secondary | ICD-10-CM

## 2015-12-16 LAB — CBC WITH DIFFERENTIAL/PLATELET
BASOS ABS: 87 {cells}/uL (ref 0–200)
Basophils Relative: 1 %
EOS ABS: 261 {cells}/uL (ref 15–500)
Eosinophils Relative: 3 %
HEMATOCRIT: 49.1 % (ref 38.5–50.0)
HEMOGLOBIN: 16.2 g/dL (ref 13.2–17.1)
LYMPHS ABS: 1653 {cells}/uL (ref 850–3900)
LYMPHS PCT: 19 %
MCH: 30.7 pg (ref 27.0–33.0)
MCHC: 33 g/dL (ref 32.0–36.0)
MCV: 93 fL (ref 80.0–100.0)
MONO ABS: 609 {cells}/uL (ref 200–950)
MPV: 9.6 fL (ref 7.5–12.5)
Monocytes Relative: 7 %
NEUTROS PCT: 70 %
Neutro Abs: 6090 cells/uL (ref 1500–7800)
Platelets: 249 10*3/uL (ref 140–400)
RBC: 5.28 MIL/uL (ref 4.20–5.80)
RDW: 15.3 % — AB (ref 11.0–15.0)
WBC: 8.7 10*3/uL (ref 3.8–10.8)

## 2015-12-17 LAB — COMPLETE METABOLIC PANEL WITH GFR
ALK PHOS: 120 U/L — AB (ref 40–115)
ALT: 14 U/L (ref 9–46)
AST: 13 U/L (ref 10–35)
Albumin: 3.8 g/dL (ref 3.6–5.1)
BILIRUBIN TOTAL: 0.4 mg/dL (ref 0.2–1.2)
BUN: 18 mg/dL (ref 7–25)
CALCIUM: 9.2 mg/dL (ref 8.6–10.3)
CO2: 25 mmol/L (ref 20–31)
Chloride: 104 mmol/L (ref 98–110)
Creat: 1.32 mg/dL (ref 0.70–1.33)
GFR, EST AFRICAN AMERICAN: 68 mL/min (ref >=60)
GFR, EST NON AFRICAN AMERICAN: 59 mL/min — AB (ref >=60)
Glucose, Bld: 102 mg/dL — ABNORMAL HIGH (ref 65–99)
Potassium: 4.4 mmol/L (ref 3.5–5.3)
Sodium: 138 mmol/L (ref 135–146)
TOTAL PROTEIN: 7.7 g/dL (ref 6.1–8.1)

## 2015-12-17 LAB — T-HELPER CELL (CD4) - (RCID CLINIC ONLY)
CD4 T CELL ABS: 130 /uL — AB (ref 400–2700)
CD4 T CELL HELPER: 7 % — AB (ref 33–55)

## 2015-12-17 LAB — RPR

## 2015-12-18 ENCOUNTER — Other Ambulatory Visit

## 2015-12-18 LAB — HIV-1 RNA ULTRAQUANT REFLEX TO GENTYP+: HIV 1 RNA Quant: <20 copies/mL (ref <20)

## 2016-01-01 ENCOUNTER — Telehealth: Payer: Self-pay | Admitting: *Deleted

## 2016-01-01 ENCOUNTER — Ambulatory Visit: Admitting: Infectious Diseases

## 2016-01-01 ENCOUNTER — Telehealth: Payer: Self-pay | Admitting: Infectious Diseases

## 2016-01-01 NOTE — Telephone Encounter (Addendum)
Patient called stating he can not come to today's appointment, however he would like Dr. Ninetta LightsHatcher to call him to go over his lab results with him. Last office visit was April of 2016.  # [phone]. Wendall MolaJacqueline Javarie Crisp

## 2016-01-01 NOTE — Telephone Encounter (Signed)
Called pt to discuss his labs.  He is out of town.  Concerned that he has weakened urinary stream, wants to see uro. He may do this when [place] in [place].   HIV 1 RNA Quant (copies/mL)  Date Value  12/16/2015 <20  07/06/2014 <20  09/05/2013 162 (H)   CD4 T Cell Abs (/uL)  Date Value  12/16/2015 130 (L)  07/06/2014 100 (L)  09/05/2013 70 (L)

## 2016-01-10 ENCOUNTER — Other Ambulatory Visit: Payer: Self-pay | Admitting: Infectious Diseases

## 2016-01-10 DIAGNOSIS — B20 Human immunodeficiency virus [HIV] disease: Secondary | ICD-10-CM

## 2016-02-17 ENCOUNTER — Other Ambulatory Visit: Payer: Self-pay | Admitting: Infectious Diseases

## 2016-02-17 DIAGNOSIS — B20 Human immunodeficiency virus [HIV] disease: Secondary | ICD-10-CM

## 2016-02-18 ENCOUNTER — Telehealth: Payer: Self-pay | Admitting: *Deleted

## 2016-02-18 NOTE — Telephone Encounter (Signed)
Left message for patient, asking if he has relocated to [place] - he has not seen a MD since 07/2014, but had labs drawn 2 months ago. [place]  Unsure if patient needs assistance to transfer care closer to [place]. RN spoke with pharmacy, sent 1 more refill with the request for patient to contact [facility] asap. Andree CossHowell, Sena Hoopingarner M, RN

## 2016-03-17 ENCOUNTER — Other Ambulatory Visit: Payer: Self-pay | Admitting: Infectious Diseases

## 2016-03-17 DIAGNOSIS — B20 Human immunodeficiency virus [HIV] disease: Secondary | ICD-10-CM

## 2016-03-18 ENCOUNTER — Other Ambulatory Visit: Payer: Self-pay | Admitting: Infectious Diseases

## 2016-03-18 DIAGNOSIS — B20 Human immunodeficiency virus [HIV] disease: Secondary | ICD-10-CM

## 2016-04-07 ENCOUNTER — Other Ambulatory Visit: Payer: Self-pay | Admitting: Infectious Diseases

## 2016-04-07 DIAGNOSIS — B20 Human immunodeficiency virus [HIV] disease: Secondary | ICD-10-CM

## 2016-04-14 ENCOUNTER — Telehealth: Payer: Self-pay

## 2016-04-14 NOTE — Telephone Encounter (Signed)
Patient is calling to request refill of HIV medications. He states he has not established with an ID office.    We are not able to refill his medication since he is no longer in care with [facility]. Last OV 2016.  Laurell Josephsammy K Raneisha Bress, RN

## 2016-04-29 ENCOUNTER — Telehealth: Payer: Self-pay | Admitting: *Deleted

## 2016-04-29 ENCOUNTER — Other Ambulatory Visit: Payer: Self-pay | Admitting: *Deleted

## 2016-04-29 DIAGNOSIS — B20 Human immunodeficiency virus [HIV] disease: Secondary | ICD-10-CM

## 2016-04-29 MED ORDER — ABACAVIR SULFATE-LAMIVUDINE 600-300 MG PO TABS: 1.0000 | ORAL_TABLET | Freq: Every day | ORAL | 0 refills | Status: AC

## 2016-04-29 MED ORDER — DOLUTEGRAVIR SODIUM 50 MG PO TABS: 50.0000 mg | ORAL_TABLET | Freq: Every day | ORAL | 0 refills | Status: AC

## 2016-04-29 NOTE — Telephone Encounter (Signed)
Patient called requesting a one month refill until he can get in with new provider in [place]. Because he had labs done 12/2015, I did send a one month refill. I called [place] clinic and they needed notes before they could schedule the patient. Two office notes, and labs faxed to [phone] as requested by patient.  David MolaJacqueline Eveny Anastas

## 2016-06-01 ENCOUNTER — Other Ambulatory Visit: Payer: Self-pay | Admitting: Infectious Diseases

## 2016-06-01 DIAGNOSIS — B20 Human immunodeficiency virus [HIV] disease: Secondary | ICD-10-CM

## 2018-09-05 DEATH — deceased
